# Patient Record
Sex: Female | Born: 1980 | Race: White | Hispanic: No | Marital: Married | Smoking: Never smoker
Health system: Southern US, Community
[De-identification: ages and names within clinical notes are randomized; demographics above are authoritative.]

## PROBLEM LIST (undated history)

## (undated) DIAGNOSIS — R519 Headache, unspecified: Secondary | ICD-10-CM

## (undated) DIAGNOSIS — F419 Anxiety disorder, unspecified: Secondary | ICD-10-CM

## (undated) DIAGNOSIS — Z9889 Other specified postprocedural states: Secondary | ICD-10-CM

## (undated) DIAGNOSIS — I10 Essential (primary) hypertension: Secondary | ICD-10-CM

## (undated) DIAGNOSIS — R51 Headache: Secondary | ICD-10-CM

## (undated) DIAGNOSIS — K219 Gastro-esophageal reflux disease without esophagitis: Secondary | ICD-10-CM

## (undated) DIAGNOSIS — O09299 Supervision of pregnancy with other poor reproductive or obstetric history, unspecified trimester: Secondary | ICD-10-CM

## (undated) DIAGNOSIS — O09529 Supervision of elderly multigravida, unspecified trimester: Secondary | ICD-10-CM

## (undated) DIAGNOSIS — Z6741 Type O blood, Rh negative: Secondary | ICD-10-CM

## (undated) DIAGNOSIS — R112 Nausea with vomiting, unspecified: Secondary | ICD-10-CM

## (undated) DIAGNOSIS — T8859XA Other complications of anesthesia, initial encounter: Secondary | ICD-10-CM

## (undated) HISTORY — DX: Supervision of pregnancy with other poor reproductive or obstetric history, unspecified trimester: O09.299

## (undated) HISTORY — PX: BREAST SURGERY: SHX581

## (undated) HISTORY — DX: Type O blood, Rh negative: Z67.41

## (undated) HISTORY — DX: Gastro-esophageal reflux disease without esophagitis: K21.9

## (undated) HISTORY — DX: Supervision of elderly multigravida, unspecified trimester: O09.529

## (undated) HISTORY — PX: BUNIONECTOMY: SHX129

---

## 2003-11-07 ENCOUNTER — Ambulatory Visit: Payer: Self-pay | Admitting: Podiatry

## 2004-12-17 ENCOUNTER — Ambulatory Visit: Payer: Self-pay | Admitting: Obstetrics and Gynecology

## 2005-01-03 HISTORY — PX: LAPAROSCOPY: SHX197

## 2005-08-19 ENCOUNTER — Ambulatory Visit: Payer: Self-pay | Admitting: Unknown Physician Specialty

## 2005-09-09 ENCOUNTER — Ambulatory Visit: Payer: Self-pay | Admitting: Unknown Physician Specialty

## 2006-06-21 ENCOUNTER — Ambulatory Visit: Payer: Self-pay | Admitting: Family Medicine

## 2007-01-04 IMAGING — US ABDOMEN ULTRASOUND
1 series · 17 of 25 positions shown · non-contrast
Comparison: none

REASON FOR EXAM: abd pain epigastric N/V
COMMENTS:

[Series 1: abdomen ultrasound · 17 of 63 slices shown]
[im 1/63]
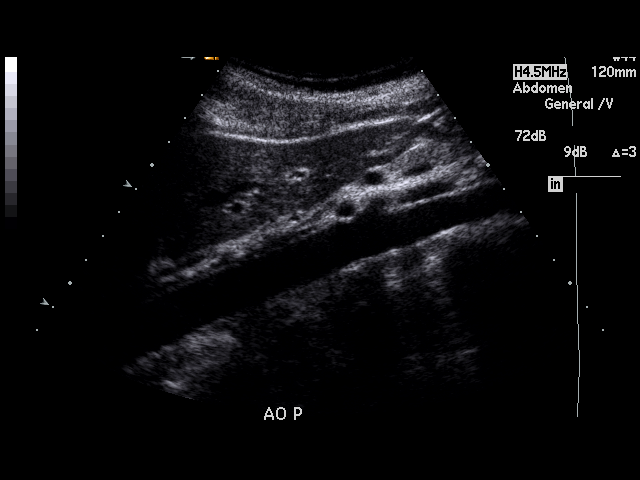
[im 6/63]
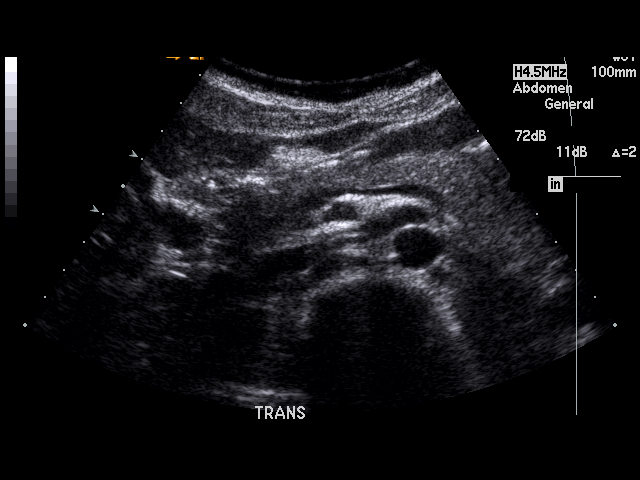
[im 8/63]
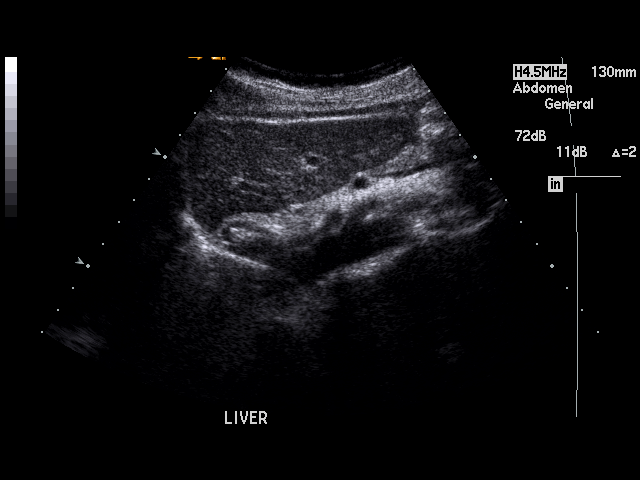
[im 13/63]
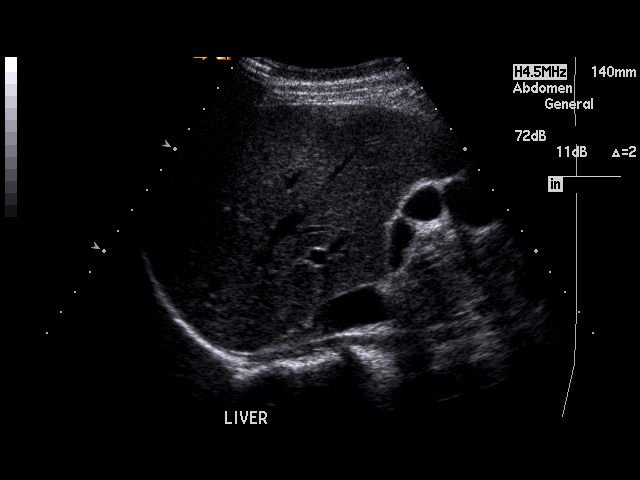
[im 16/63]
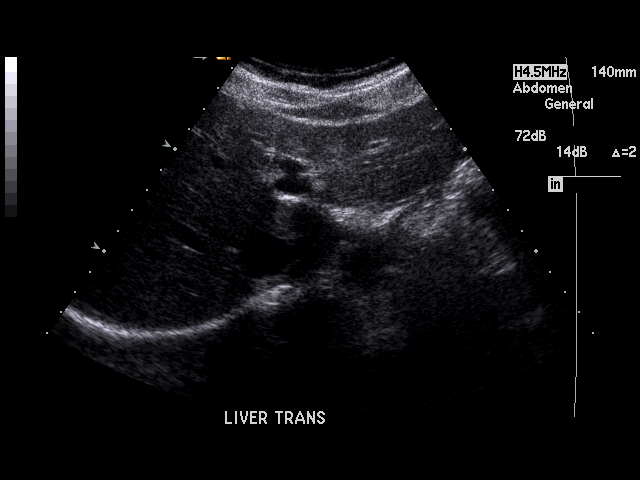
[im 21/63]
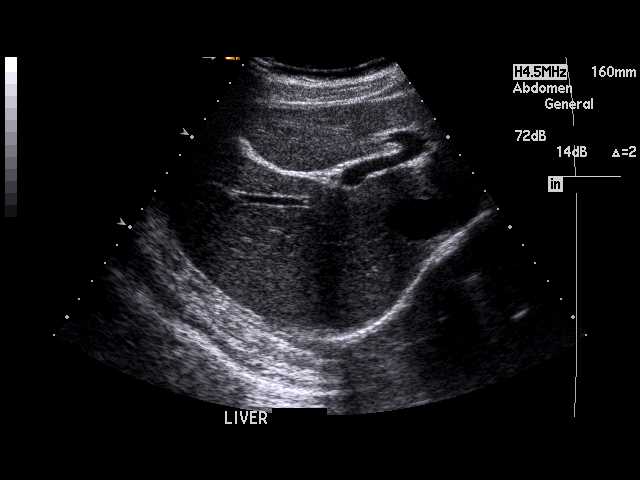
[im 24/63]
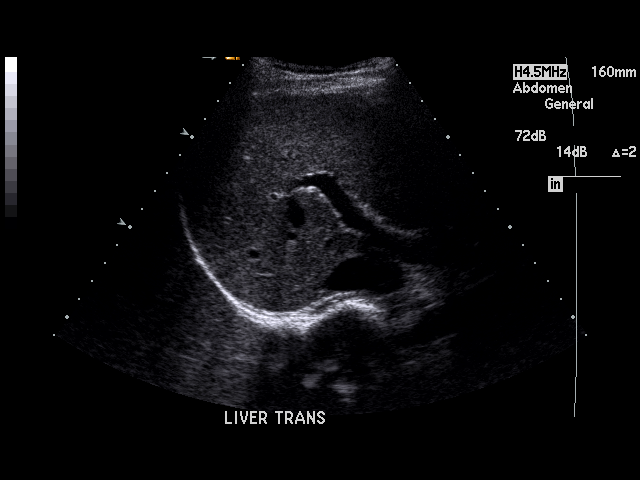
[im 29/63]
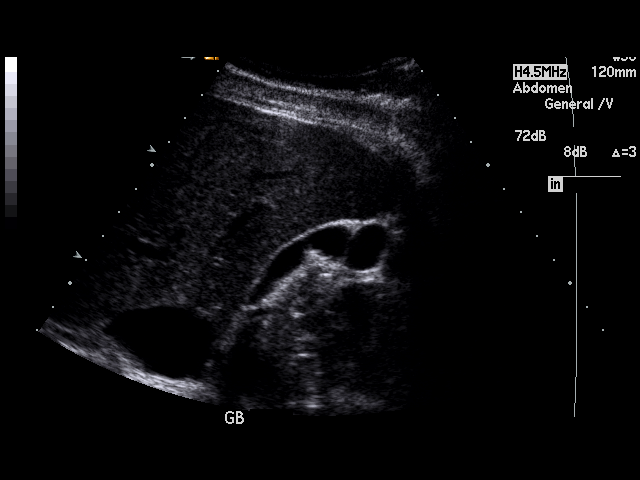
[im 32/63]
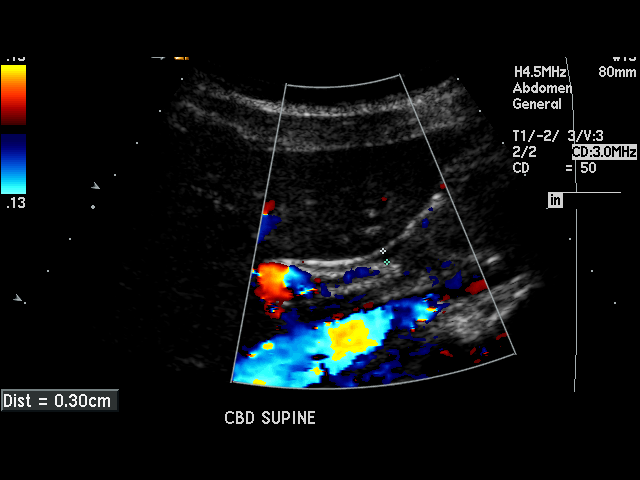
[im 34/63]
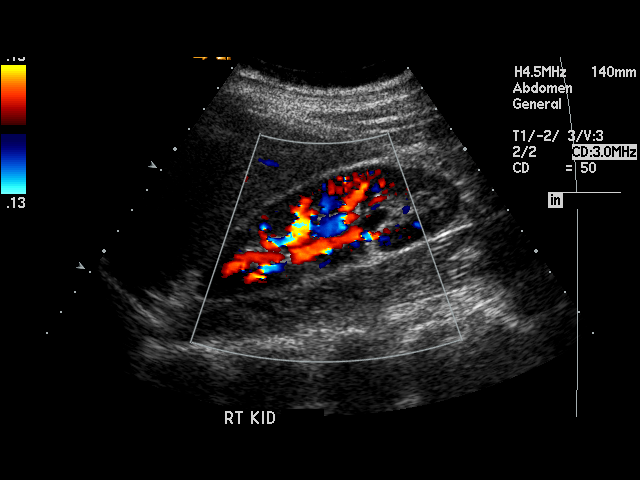
[im 39/63]
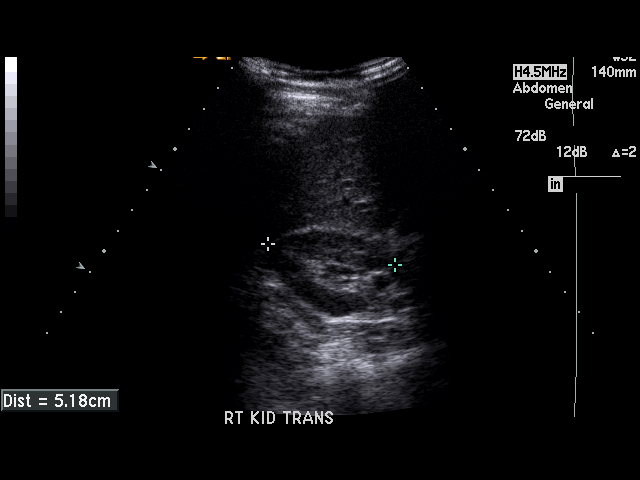
[im 42/63]
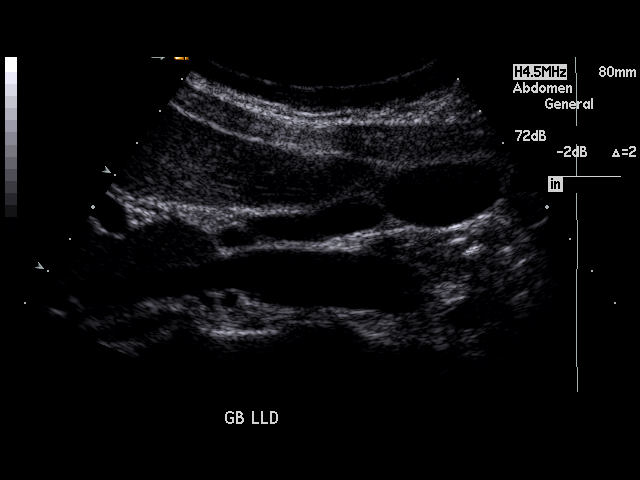
[im 47/63]
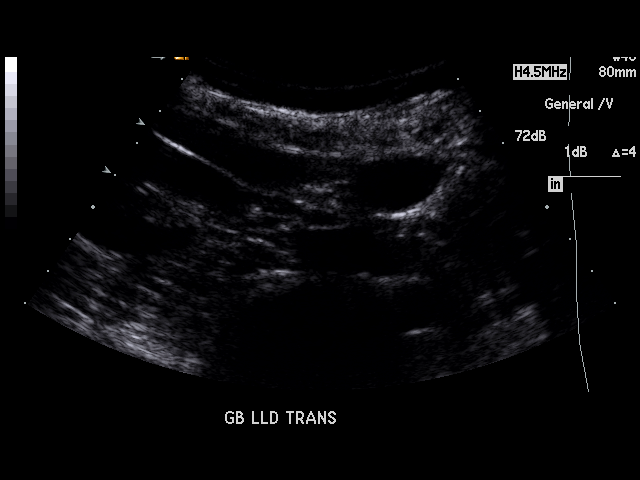
[im 50/63]
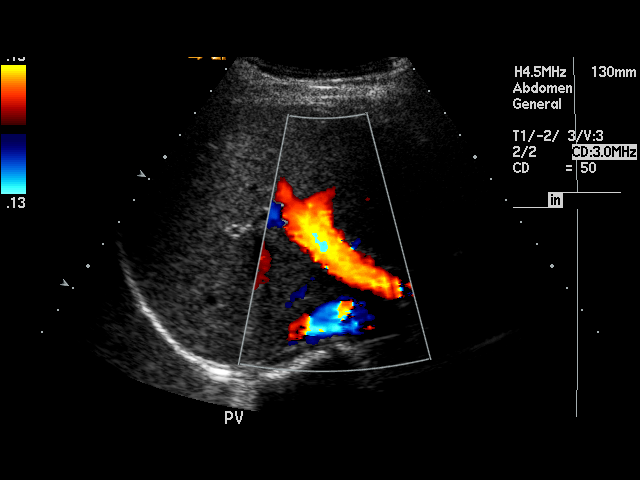
[im 55/63]
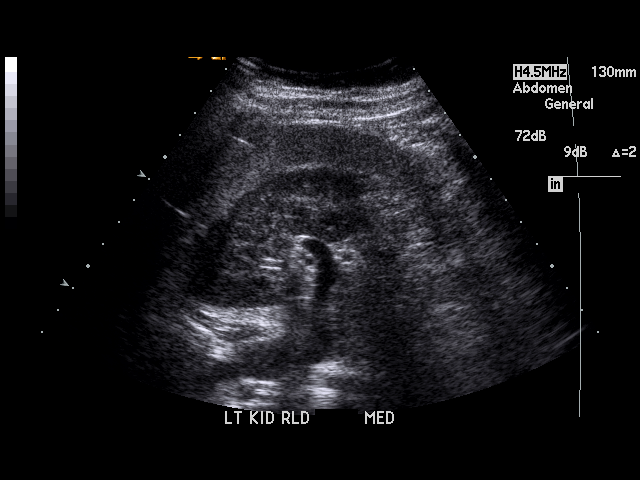
[im 57/63]
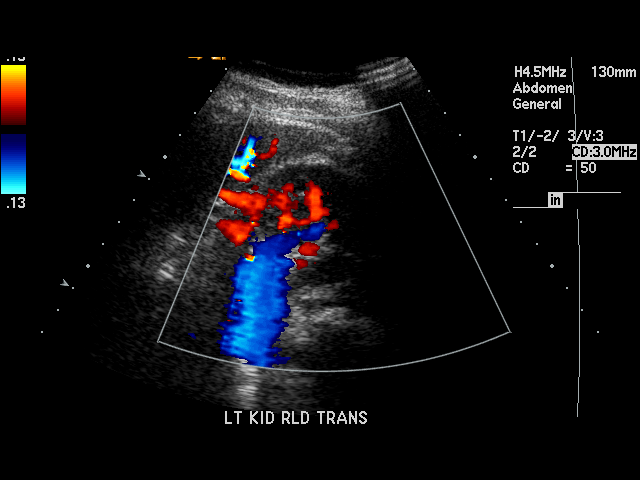
[im 63/63]
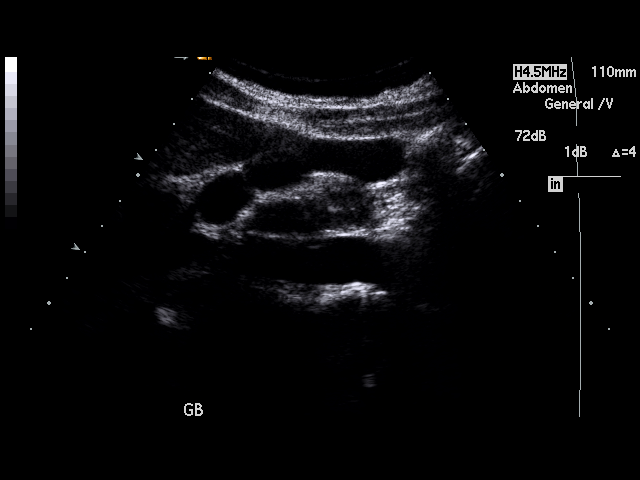

[17 of 25 positions shown; findings below may reference images not displayed]

PROCEDURE:     US  - US ABDOMEN GENERAL SURVEY  - September 09, 2005  [DATE]

RESULT:     The liver, spleen and pancreas are normal in appearance. No
gallstones are seen. There is no thickening of the gallbladder wall. The
common bile duct measures 3.1 mm in diameter, which is within normal limits.
The kidneys show no hydronephrosis. There is no ascites.
IMPRESSION: 1)No significant abnormalities are noted.

## 2009-12-31 ENCOUNTER — Ambulatory Visit: Payer: Self-pay | Admitting: Obstetrics & Gynecology

## 2010-09-10 ENCOUNTER — Emergency Department: Payer: Self-pay | Admitting: Emergency Medicine

## 2010-11-08 ENCOUNTER — Ambulatory Visit: Payer: Self-pay | Admitting: Obstetrics & Gynecology

## 2011-10-11 ENCOUNTER — Observation Stay: Payer: Self-pay | Admitting: Obstetrics & Gynecology

## 2011-10-11 LAB — PIH PROFILE
BUN: 8 mg/dL (ref 7–18)
Chloride: 109 mmol/L — ABNORMAL HIGH (ref 98–107)
Creatinine: 0.67 mg/dL (ref 0.60–1.30)
EGFR (African American): >60
MCH: 30.1 pg (ref 26.0–34.0)
MCV: 92 fL (ref 80–100)
Platelet: 126 10*3/uL — ABNORMAL LOW (ref 150–440)
Potassium: 4 mmol/L (ref 3.5–5.1)
RBC: 4.02 10*6/uL (ref 3.80–5.20)
Uric Acid: 3.3 mg/dL (ref 2.6–6.0)

## 2011-10-12 LAB — PROTEIN / CREATININE RATIO, URINE
Creatinine, Urine: 57.6 mg/dL (ref 30.0–125.0)
Protein/Creat. Ratio: 4080 mg/gCREAT — ABNORMAL HIGH (ref 0–200)

## 2011-10-16 DIAGNOSIS — O09299 Supervision of pregnancy with other poor reproductive or obstetric history, unspecified trimester: Secondary | ICD-10-CM

## 2014-04-18 DIAGNOSIS — F064 Anxiety disorder due to known physiological condition: Secondary | ICD-10-CM | POA: Insufficient documentation

## 2014-04-18 DIAGNOSIS — B9689 Other specified bacterial agents as the cause of diseases classified elsewhere: Secondary | ICD-10-CM | POA: Insufficient documentation

## 2014-04-18 DIAGNOSIS — J069 Acute upper respiratory infection, unspecified: Secondary | ICD-10-CM

## 2014-05-13 NOTE — H&P (Signed)
L&D Evaluation:  History Expanded:   HPI 34 yo first baby years of infertility now at 67 weeks with known gestational HTN over last two weeks checking BP at home and recently taken out of work and NOW NOTICED HIGHER BPs AT HOME.  No h/a, blurry vision, CP.  Patient has had edema of lower extremities and in face when first wakes in mornings.  Pregnancy Induced Hypertension labs 8 days ago normal.    Gravida 2    Term 0    PreTerm 0    Abortion 1    Living 0    Blood Type (Maternal) O negative    Maternal Varicella Equivocal    Rubella Results (Maternal) immune    Presents with HTN    Patient's Medical History GERD    Patient's Surgical History Laparoscopy, Bunionectomy    Medications Pre Natal Vitamins    Allergies Sulfa    Social History none    Family History Non-Contributory   ROS:   ROS All systems were reviewed.  HEENT, CNS, GI, GU, Respiratory, CV, Renal and Musculoskeletal systems were found to be normal.   Exam:   Vital Signs stable  BPs here 130s/80s    General no apparent distress    Mental Status clear    Chest clear    Heart normal sinus rhythm, no murmur/gallop/rubs    Abdomen gravid, non-tender    Estimated Fetal Weight Average for gestational age    Back no CVAT    Edema 1+    Reflexes 2+    Clonus negative    FHT normal rate with no decels    Ucx absent    Skin dry    Other LABS-  see reports, Plt 126 and Urine Prot/Cr ratio 4000   Impression:   Impression evaluation for PIH, with concern for preclampsia   Plan:   Plan monitor BP, 24 hour urine protein, Bed rest    Comments In house for now Steroids Consider transfer to [facility] if anticipate delivery Repeat labs in am, to go along with 24 hour urine results  Patient is counseled at length about the risks and consequences of Pregnancy Induced Hypertension, especially as it pertains to the development of preclampsia.  Risks of preclampsia include maternal and fetal  complications, usually necessitating active delivery planning.  Physical exam findings, fetal heart rate monitoring, and laboratory findings are utilized together to determine the presence and severity of any gestational hypertension disorder.  Further management will be based on these findings.  Continued bed rest and frequent follow-up for blood pressure checks and symptom evaluation is of utmost importance for the remainder of pregnancy.   Patient has been counseled as to the risks of prematurity, including risks of respiratory depression or distress, jaundice, feeding or temperature regulation problems, neurologic concerns including hearing or visual problems, and brain complications.   Electronic Signatures: Hoyt Koch (MD)  (Signed 09-Oct-13 07:22)  Authored: L&D Evaluation   Last Updated: 09-Oct-13 07:22 by Hoyt Koch (MD)

## 2014-09-15 LAB — HM MAMMOGRAPHY

## 2015-02-13 ENCOUNTER — Ambulatory Visit: Payer: Self-pay | Admitting: Family Medicine

## 2015-02-13 ENCOUNTER — Ambulatory Visit (INDEPENDENT_AMBULATORY_CARE_PROVIDER_SITE_OTHER): Payer: PRIVATE HEALTH INSURANCE | Admitting: Family Medicine

## 2015-02-13 ENCOUNTER — Other Ambulatory Visit
Admission: RE | Admit: 2015-02-13 | Discharge: 2015-02-13 | Disposition: A | Discharge status: Home / Self Care | Payer: PRIVATE HEALTH INSURANCE | Source: Ambulatory Visit | Attending: Family Medicine | Admitting: Family Medicine

## 2015-02-13 ENCOUNTER — Ambulatory Visit
Admission: RE | Admit: 2015-02-13 | Discharge: 2015-02-13 | Disposition: A | Discharge status: Home / Self Care | Payer: PRIVATE HEALTH INSURANCE | Source: Ambulatory Visit | Attending: Family Medicine | Admitting: Family Medicine

## 2015-02-13 ENCOUNTER — Encounter: Payer: Self-pay | Admitting: Family Medicine

## 2015-02-13 VITALS — BP 128/88 | HR 88 | Temp 97.8°F | Resp 14 | Ht 64.0 in | Wt 126.0 lb

## 2015-02-13 DIAGNOSIS — J219 Acute bronchiolitis, unspecified: Secondary | ICD-10-CM | POA: Insufficient documentation

## 2015-02-13 DIAGNOSIS — R0600 Dyspnea, unspecified: Secondary | ICD-10-CM

## 2015-02-13 LAB — FIBRIN DERIVATIVES D-DIMER (ARMC ONLY): FIBRIN DERIVATIVES D-DIMER (ARMC): 191 (ref 0–499)

## 2015-02-13 MED ORDER — LEVOFLOXACIN 500 MG PO TABS: 500.0000 mg | ORAL_TABLET | Freq: Every day | ORAL | Status: DC

## 2015-02-13 MED ORDER — ALBUTEROL SULFATE (2.5 MG/3ML) 0.083% IN NEBU: 2.5000 mg | INHALATION_SOLUTION | Freq: Once | RESPIRATORY_TRACT | Status: DC

## 2015-02-13 NOTE — Progress Notes (Signed)
Name: Denise Clarke   MRN: [id number]    DOB: Jan 04, 1981   Date:02/13/2015       Progress Note  Subjective  Chief Complaint  Chief Complaint  Patient presents with  . Sinusitis    sore throat, dizziness, headache, cong x 4 days- taking Sudafed. Has a cough- brown production    Sinusitis This is a new problem. The current episode started in the past 7 days. The problem has been gradually worsening since onset. She is experiencing no pain. Associated symptoms include chills, congestion, coughing, diaphoresis, ear pain, headaches, a hoarse voice, neck pain, shortness of breath, sinus pressure, sneezing and a sore throat. Pertinent negatives include no swollen glands. Past treatments include acetaminophen and oral decongestants. The treatment provided no relief.  Cough This is a new problem. The current episode started in the past 7 days. The problem has been gradually worsening. The cough is productive of brown sputum. Associated symptoms include chills, ear pain, a fever, headaches, a sore throat and shortness of breath. Pertinent negatives include no chest pain, heartburn, myalgias, nasal congestion, postnasal drip, rash, weight loss or wheezing. The treatment provided mild relief. There is no history of environmental allergies.    No problem-specific assessment & plan notes found for this encounter.   History reviewed. No pertinent past medical history.  Past Surgical History  Procedure Laterality Date  . Cesarean section      Family History  Problem Relation Age of Onset  . Diabetes Father   . Cancer Brother     Social History   Social History  . Marital Status: Married    Spouse Name: N/A  . Number of Children: N/A  . Years of Education: N/A   Occupational History  . Not on file.   Social History Main Topics  . Smoking status: Never Smoker   . Smokeless tobacco: Not on file  . Alcohol Use: 0.0 oz/week    0 Standard drinks or equivalent per week  . Drug Use: No   . Sexual Activity: Yes   Other Topics Concern  . Not on file   Social History Narrative    Allergies  Allergen Reactions  . Sulfa Antibiotics      Review of Systems  Constitutional: Positive for fever, chills and diaphoresis. Negative for weight loss and malaise/fatigue.  HENT: Positive for congestion, ear pain, hoarse voice, sinus pressure, sneezing and sore throat. Negative for ear discharge and postnasal drip.   Eyes: Negative for blurred vision.  Respiratory: Positive for cough and shortness of breath. Negative for sputum production and wheezing.   Cardiovascular: Negative for chest pain, palpitations and leg swelling.  Gastrointestinal: Negative for heartburn, nausea, abdominal pain, diarrhea, constipation, blood in stool and melena.  Genitourinary: Negative for dysuria, urgency, frequency and hematuria.  Musculoskeletal: Positive for neck pain. Negative for myalgias, back pain and joint pain.  Skin: Negative for rash.  Neurological: Positive for headaches. Negative for dizziness, tingling, sensory change and focal weakness.  Endo/Heme/Allergies: Negative for environmental allergies and polydipsia. Does not bruise/bleed easily.  Psychiatric/Behavioral: Negative for depression and suicidal ideas. The patient is not nervous/anxious and does not have insomnia.      Objective  Filed Vitals:   02/13/15 1114  BP: 128/88  Pulse: 88  Temp: 97.8 F (36.6 C)  TempSrc: Oral  Resp: 14  Height: 5\' 4"  (1.626 m)  Weight: 126 lb (57.153 kg)  SpO2: 99%    Physical Exam  Constitutional: She is well-developed, well-nourished, and in no  distress. No distress.  HENT:  Head: Normocephalic and atraumatic.  Right Ear: Tympanic membrane, external ear and ear canal normal.  Left Ear: Tympanic membrane, external ear and ear canal normal.  Nose: Nose normal.  Mouth/Throat: Oropharynx is clear and moist.  Eyes: Conjunctivae and EOM are normal. Pupils are equal, round, and reactive to  light. Right eye exhibits no discharge. Left eye exhibits no discharge.  Neck: Normal range of motion. Neck supple. No JVD present. No thyromegaly present.  Cardiovascular: Normal rate, regular rhythm, normal heart sounds and intact distal pulses.  Exam reveals no gallop and no friction rub.   No murmur heard. Pulmonary/Chest: Effort normal and breath sounds normal.  Abdominal: Soft. Bowel sounds are normal. She exhibits no mass. There is no tenderness. There is no guarding.  Musculoskeletal: Normal range of motion. She exhibits no edema.  Lymphadenopathy:    She has no cervical adenopathy.  Neurological: She is alert.  Skin: Skin is warm and dry. She is not diaphoretic.  Psychiatric: Mood and affect normal.  Nursing note and vitals reviewed.     Assessment & Plan  Problem List Items Addressed This Visit    None    Visit Diagnoses    Bronchiolitis    -  Primary    Relevant Medications    albuterol (PROVENTIL) (2.5 MG/3ML) 0.083% nebulizer solution 2.5 mg    Other Relevant Orders    DG Chest 2 View    Dyspnea             Dr. Otilio Miu [facility]  02/13/2015

## 2015-08-13 ENCOUNTER — Ambulatory Visit (INDEPENDENT_AMBULATORY_CARE_PROVIDER_SITE_OTHER): Payer: PRIVATE HEALTH INSURANCE | Admitting: Family Medicine

## 2015-08-13 ENCOUNTER — Encounter: Payer: Self-pay | Admitting: Family Medicine

## 2015-08-13 VITALS — BP 110/62 | HR 80 | Ht 64.0 in | Wt 127.0 lb

## 2015-08-13 DIAGNOSIS — Z23 Encounter for immunization: Secondary | ICD-10-CM | POA: Diagnosis not present

## 2015-08-13 DIAGNOSIS — J189 Pneumonia, unspecified organism: Secondary | ICD-10-CM

## 2015-08-13 DIAGNOSIS — T63461A Toxic effect of venom of wasps, accidental (unintentional), initial encounter: Secondary | ICD-10-CM

## 2015-08-13 DIAGNOSIS — J45901 Unspecified asthma with (acute) exacerbation: Secondary | ICD-10-CM | POA: Diagnosis not present

## 2015-08-13 MED ORDER — ALBUTEROL SULFATE HFA 108 (90 BASE) MCG/ACT IN AERS: 2.0000 | INHALATION_SPRAY | Freq: Four times a day (QID) | RESPIRATORY_TRACT | 0 refills | Status: DC | PRN

## 2015-08-13 MED ORDER — AZITHROMYCIN 250 MG PO TABS: ORAL_TABLET | ORAL | 0 refills | Status: DC

## 2015-08-13 NOTE — Progress Notes (Signed)
Name: Denise Clarke   MRN: [id number]    DOB: 1980-07-14   Date:08/13/2015       Progress Note  Subjective  Chief Complaint  Chief Complaint  Patient presents with  . Asthma    started Saturday after doing yard work- noticed rattling in chest when breathing. Feels SOB when lying down at night.   . Insect Bite    Asthma  She complains of cough, shortness of breath and wheezing. There is no chest tightness, difficulty breathing, frequent throat clearing, hemoptysis, hoarse voice or sputum production. This is a recurrent problem. The current episode started in the past 7 days. The problem occurs daily. The problem has been waxing and waning. The cough is non-productive. Associated symptoms include dyspnea on exertion, sneezing and sweats. Pertinent negatives include no chest pain, ear congestion, ear pain, fever, headaches, heartburn, malaise/fatigue, myalgias, nasal congestion, orthopnea, PND, postnasal drip, rhinorrhea, sore throat or weight loss. Her symptoms are aggravated by nothing. Her symptoms are alleviated by nothing. She reports minimal (nebulization) improvement on treatment. Her past medical history is significant for asthma. There is no history of bronchiectasis, bronchitis, COPD, emphysema or pneumonia.    No problem-specific Assessment & Plan notes found for this encounter.   History reviewed. No pertinent past medical history.  Past Surgical History:  Procedure Laterality Date  . CESAREAN SECTION      Family History  Problem Relation Age of Onset  . Diabetes Father   . Cancer Brother     Social History   Social History  . Marital status: Married    Spouse name: N/A  . Number of children: N/A  . Years of education: N/A   Occupational History  . Not on file.   Social History Main Topics  . Smoking status: Never Smoker  . Smokeless tobacco: Never Used  . Alcohol use 0.0 oz/week  . Drug use: No  . Sexual activity: Yes   Other Topics Concern  . Not on  file   Social History Narrative  . No narrative on file    Allergies  Allergen Reactions  . Sulfa Antibiotics      Review of Systems  Constitutional: Negative for chills, fever, malaise/fatigue and weight loss.  HENT: Positive for sneezing. Negative for ear discharge, ear pain, hoarse voice, postnasal drip, rhinorrhea and sore throat.   Eyes: Negative for blurred vision.  Respiratory: Positive for cough, shortness of breath and wheezing. Negative for hemoptysis and sputum production.   Cardiovascular: Positive for dyspnea on exertion. Negative for chest pain, palpitations, leg swelling and PND.  Gastrointestinal: Negative for abdominal pain, blood in stool, constipation, diarrhea, heartburn, melena and nausea.  Genitourinary: Negative for dysuria, frequency, hematuria and urgency.  Musculoskeletal: Negative for back pain, joint pain, myalgias and neck pain.  Skin: Negative for rash.  Neurological: Negative for dizziness, tingling, sensory change, focal weakness and headaches.  Endo/Heme/Allergies: Negative for environmental allergies and polydipsia. Does not bruise/bleed easily.  Psychiatric/Behavioral: Negative for depression and suicidal ideas. The patient is not nervous/anxious and does not have insomnia.      Objective  Vitals:   08/13/15 0845  BP: 110/62  Pulse: 80  SpO2: 99%  Weight: 127 lb (57.6 kg)  Height: 5\' 4"  (1.626 m)    Physical Exam  Constitutional: She is well-developed, well-nourished, and in no distress. No distress.  HENT:  Head: Normocephalic and atraumatic.  Right Ear: External ear normal.  Left Ear: External ear normal.  Nose: Nose normal.  Mouth/Throat: Oropharynx  is clear and moist.  Eyes: Conjunctivae and EOM are normal. Pupils are equal, round, and reactive to light. Right eye exhibits no discharge. Left eye exhibits no discharge.  Neck: Normal range of motion. Neck supple. No JVD present. No thyromegaly present.  Cardiovascular: Normal rate,  regular rhythm, normal heart sounds and intact distal pulses.  Exam reveals no gallop and no friction rub.   No murmur heard. Pulmonary/Chest: Effort normal. No respiratory distress. She has decreased breath sounds in the left lower field. She has wheezes in the left lower field. She has no rhonchi. She has no rales. She exhibits no tenderness.  Abdominal: Soft. Bowel sounds are normal. She exhibits no mass. There is no tenderness. There is no guarding.  Musculoskeletal: Normal range of motion. She exhibits no edema.  Lymphadenopathy:    She has no cervical adenopathy.  Neurological: She is alert. She has normal reflexes.  Skin: Skin is warm and dry. She is not diaphoretic.  Psychiatric: Mood and affect normal.  Nursing note and vitals reviewed.     Assessment & Plan  Problem List Items Addressed This Visit    None    Visit Diagnoses    Wasp sting, accidental or unintentional, initial encounter    -  Primary   Relevant Orders   Tdap vaccine greater than or equal to 7yo IM (Completed)   CAP (community acquired pneumonia)       Relevant Medications   azithromycin (ZITHROMAX) 250 MG tablet   albuterol (PROVENTIL HFA;VENTOLIN HFA) 108 (90 Base) MCG/ACT inhaler   Reactive airway disease with acute exacerbation       Relevant Medications   albuterol (PROVENTIL HFA;VENTOLIN HFA) 108 (90 Base) MCG/ACT inhaler        Dr. [facility]  08/13/15

## 2015-10-23 LAB — HEPATIC FUNCTION PANEL
ALK PHOS: 48 U/L (ref 25–125)
ALT: 16 U/L (ref 7–35)
AST: 17 U/L (ref 13–35)
Bilirubin, Total: 0.6 mg/dL

## 2015-10-23 LAB — OB RESULTS CONSOLE HEPATITIS B SURFACE ANTIGEN: Hepatitis B Surface Ag: NEGATIVE

## 2015-10-23 LAB — OB RESULTS CONSOLE HGB/HCT, BLOOD
HCT: 41 %
HEMOGLOBIN: 14.3 g/dL

## 2015-10-23 LAB — OB RESULTS CONSOLE GC/CHLAMYDIA
CHLAMYDIA, DNA PROBE: NEGATIVE
GC PROBE AMP, GENITAL: NEGATIVE

## 2015-10-23 LAB — BASIC METABOLIC PANEL
BUN: 11 mg/dL (ref 4–21)
CREATININE: 0.8 mg/dL (ref 0.5–1.1)
Glucose: 85 mg/dL
POTASSIUM: 4.6 mmol/L (ref 3.4–5.3)
Sodium: 140 mmol/L (ref 137–147)

## 2015-10-23 LAB — OB RESULTS CONSOLE RPR: RPR: NONREACTIVE

## 2015-10-23 LAB — OB RESULTS CONSOLE VARICELLA ZOSTER ANTIBODY, IGG: VARICELLA IGG: NON-IMMUNE/NOT IMMUNE

## 2015-10-23 LAB — OB RESULTS CONSOLE ABO/RH: RH Type: NEGATIVE

## 2015-10-23 LAB — OB RESULTS CONSOLE RUBELLA ANTIBODY, IGM: Rubella: NON-IMMUNE/NOT IMMUNE

## 2015-10-23 LAB — OB RESULTS CONSOLE ANTIBODY SCREEN: ANTIBODY SCREEN: NEGATIVE

## 2015-10-23 LAB — OB RESULTS CONSOLE HIV ANTIBODY (ROUTINE TESTING): HIV: NONREACTIVE

## 2015-10-23 LAB — OB RESULTS CONSOLE PLATELET COUNT: PLATELETS: 190 10*3/uL

## 2015-10-23 LAB — HM PAP SMEAR

## 2016-03-11 ENCOUNTER — Encounter: Payer: Self-pay | Admitting: Obstetrics & Gynecology

## 2016-03-11 ENCOUNTER — Ambulatory Visit (INDEPENDENT_AMBULATORY_CARE_PROVIDER_SITE_OTHER): Payer: PRIVATE HEALTH INSURANCE | Admitting: Obstetrics & Gynecology

## 2016-03-11 ENCOUNTER — Telehealth: Payer: Self-pay | Admitting: Obstetrics & Gynecology

## 2016-03-11 VITALS — BP 120/80 | HR 98 | Wt 151.0 lb

## 2016-03-11 DIAGNOSIS — O0990 Supervision of high risk pregnancy, unspecified, unspecified trimester: Secondary | ICD-10-CM | POA: Insufficient documentation

## 2016-03-11 DIAGNOSIS — Z3A27 27 weeks gestation of pregnancy: Secondary | ICD-10-CM

## 2016-03-11 DIAGNOSIS — O09523 Supervision of elderly multigravida, third trimester: Secondary | ICD-10-CM | POA: Insufficient documentation

## 2016-03-11 DIAGNOSIS — Z98891 History of uterine scar from previous surgery: Secondary | ICD-10-CM | POA: Insufficient documentation

## 2016-03-11 LAB — HPV APTIMA
HPV APTIMA: NEGATIVE
HPV Aptima: NEGATIVE

## 2016-03-11 LAB — MATERNIT21  PLUS CORE+ESS, BLOOD
CHROMOSOME 18 RESULT: NEGATIVE
CHROMOSOME 21: NEGATIVE
Chromosome 13 Result: NEGATIVE
Y CHROMOSOME: NOT DETECTED

## 2016-03-11 NOTE — Telephone Encounter (Signed)
Lmtrc

## 2016-03-11 NOTE — Patient Instructions (Signed)
Cesarean Delivery °Cesarean birth, or cesarean delivery, is the surgical delivery of a baby through an incision in the abdomen and the uterus. This may be referred to as a C-section. This procedure may be scheduled ahead of time, or it may be done in an emergency situation. °Tell a health care provider about: °· Any allergies you have. °· All medicines you are taking, including vitamins, herbs, eye drops, creams, and over-the-counter medicines. °· Any problems you or family members have had with anesthetic medicines. °· Any blood disorders you have. °· Any surgeries you have had. °· Any medical conditions you have. °· Whether you or any members of your family have a history of deep vein thrombosis (DVT) or pulmonary embolism (PE). °What are the risks? °Generally, this is a safe procedure. However, problems may occur, including: °· Infection. °· Bleeding. °· Allergic reactions to medicines. °· Damage to other structures or organs. °· Blood clots. °· Injury to your baby. ° °What happens before the procedure? °· Follow instructions from your health care provider about eating or drinking restrictions. °· Follow instructions from your health care provider about bathing before your procedure to help reduce your risk of infection. °· If you know that you are going to have a cesarean delivery, do not shave your pubic area. Shaving before the procedure may increase your risk of infection. °· Ask your health care provider about: °? Changing or stopping your regular medicines. This is especially important if you are taking diabetes medicines or blood thinners. °? Your pain management plan. This is especially important if you plan to breastfeed your baby. °? How long you will be in the hospital after the procedure. °? Any concerns you may have about receiving blood products if you need them during the procedure. °? Cord blood banking, if you plan to collect your baby’s umbilical cord blood. °· You may also want to ask your  health care provider: °? Whether you will be able to hold or breastfeed your baby while you are still in the operating room. °? Whether your baby can stay with you immediately after the procedure and during your recovery. °? Whether a family member or a person of your choice can go with you into the operating room and stay with you during the procedure, immediately after the procedure, and during your recovery. °· Plan to have someone drive you home when you are discharged from the hospital. °What happens during the procedure? °· Fetal monitors will be placed on your abdomen to monitor your heart rate and your baby's heart rate. °· Depending on the reason for your cesarean delivery, you may have a physical exam or additional testing, such as an ultrasound. °· An IV tube will be inserted into one of your veins. °· You may have your blood or urine tested. °· You will be given antibiotic medicine to help prevent infection. °· You may be given a special warming gown to wear to keep your temperature stable. °· Hair may be removed from your pubic area. °· The skin of your pubic area and lower abdomen will be cleaned with a germ-killing solution (antiseptic). °· A catheter may be inserted into your bladder through your urethra. This drains your urine during the procedure. °· You may be given one or more of the following: °? A medicine to numb the area (local anesthetic). °? A medicine to make you fall asleep (general anesthetic). °? A medicine (regional anesthetic) that is injected into your back or through a small   thin tube placed in your back (spinal anesthetic or epidural anesthetic). This numbs everything below the injection site and allows you to stay awake during your procedure. If this makes you feel nauseous, tell your health care provider. Medicines will be available to help reduce any nausea you may feel. °· An incision will be made in your abdomen, and then in your uterus. °· If you are awake during your  procedure, you may feel tugging and pulling in your abdomen, but you should not feel pain. If you feel pain, tell your health care provider immediately. °· Your baby will be removed from your uterus. You may feel more pressure or pushing while this happens. °· Immediately after birth, your baby will be dried and kept warm. You may be able to hold and breastfeed your baby. The umbilical cord may be clamped and cut during this time. °· Your placenta will be removed from your uterus. °· Your incisions will be closed with stitches (sutures). Staples, skin glue, or adhesive strips may also be applied to the incision in your abdomen. °· Bandages (dressings) will be placed over the incision in your abdomen. °The procedure may vary among health care providers and hospitals. °What happens after the procedure? °· Your blood pressure, heart rate, breathing rate, and blood oxygen level will be monitored often until the medicines you were given have worn off. °· You may continue to receive fluids and medicines through an IV tube. °· You will have some pain. Medicines will be available to help control your pain. °· To help prevent blood clots: °? You may be given medicines. °? You may have to wear compression stockings or devices. °? You will be encouraged to walk around when you are able. °· Hospital staff will encourage and support bonding with your baby. Your hospital may allow you and your baby to stay in the same room (rooming in) during your hospital stay to encourage successful breastfeeding. °· You may be encouraged to cough and breathe deeply often. This helps to prevent lung problems. °· If you have a catheter draining your urine, it will be removed as soon as possible after your procedure. °This information is not intended to replace advice given to you by your health care provider. Make sure you discuss any questions you have with your health care provider. °Document Released: 12/20/2004 Document Revised: 05/28/2015  Document Reviewed: 09/30/2014 °Elsevier Interactive Patient Education © 2017 [employer] ° °

## 2016-03-11 NOTE — Progress Notes (Signed)
PNV, [facility], Glc soon, CS scheduled May 23 [facility]) due to prior classical.

## 2016-03-16 ENCOUNTER — Telehealth: Payer: Self-pay | Admitting: Obstetrics & Gynecology

## 2016-03-16 NOTE — Telephone Encounter (Signed)
Pt is calling about wanting to pre schedule all remaining appointment ahead of time because she feels like it is hard to have her schedule with the only provider she wants to see. Please advise. Pt would let an call back.

## 2016-03-16 NOTE — Telephone Encounter (Signed)
Sch ROB appts April 9, 23, May 7, 14, and preop 22 (if not already scheduled)

## 2016-03-16 NOTE — Telephone Encounter (Signed)
Pt has been schedule.

## 2016-03-29 ENCOUNTER — Other Ambulatory Visit: Payer: PRIVATE HEALTH INSURANCE

## 2016-03-29 ENCOUNTER — Encounter: Payer: PRIVATE HEALTH INSURANCE | Admitting: Certified Nurse Midwife

## 2016-03-30 ENCOUNTER — Other Ambulatory Visit: Payer: PRIVATE HEALTH INSURANCE

## 2016-03-30 ENCOUNTER — Ambulatory Visit (INDEPENDENT_AMBULATORY_CARE_PROVIDER_SITE_OTHER): Payer: PRIVATE HEALTH INSURANCE | Admitting: Obstetrics & Gynecology

## 2016-03-30 VITALS — BP 120/60 | HR 103 | Wt 154.0 lb

## 2016-03-30 DIAGNOSIS — Z6791 Unspecified blood type, Rh negative: Secondary | ICD-10-CM

## 2016-03-30 DIAGNOSIS — O09892 Supervision of other high risk pregnancies, second trimester: Secondary | ICD-10-CM | POA: Diagnosis not present

## 2016-03-30 DIAGNOSIS — O0990 Supervision of high risk pregnancy, unspecified, unspecified trimester: Secondary | ICD-10-CM

## 2016-03-30 DIAGNOSIS — O26892 Other specified pregnancy related conditions, second trimester: Principal | ICD-10-CM

## 2016-03-30 DIAGNOSIS — Z98891 History of uterine scar from previous surgery: Secondary | ICD-10-CM

## 2016-03-30 DIAGNOSIS — Z3A29 29 weeks gestation of pregnancy: Secondary | ICD-10-CM

## 2016-03-30 DIAGNOSIS — O09523 Supervision of elderly multigravida, third trimester: Secondary | ICD-10-CM

## 2016-03-30 DIAGNOSIS — Z3A27 27 weeks gestation of pregnancy: Secondary | ICD-10-CM

## 2016-03-30 NOTE — Addendum Note (Signed)
Addended by: Quintella Baton D on: 03/30/2016 11:09 AM   Modules accepted: Orders

## 2016-03-30 NOTE — Progress Notes (Signed)
ok to see [place] for visits and call [place] for CS -  Blood Type - O-, RI, VI, PAP ASCUS & -HPV (repeat pp)  Desires fetal DNA testing, has risk factors - normal XX  AMA -  h/o preclampsia; baby ASA daily - baseline labs WNL  PRIOR CS - [facility]. Plan 37 week CS w PH. Also desires BTL at time of CS Glucola today Rhogam

## 2016-03-31 LAB — 28 WEEK RH+PANEL
Basophils Absolute: 0 x10E3/uL (ref 0.0–0.2)
Basos: 0 %
EOS (ABSOLUTE): 0.2 x10E3/uL (ref 0.0–0.4)
Eos: 2 %
Gestational Diabetes Screen: 90 mg/dL (ref 65–139)
HIV Screen 4th Generation wRfx: NONREACTIVE
Hematocrit: 33.1 % — ABNORMAL LOW (ref 34.0–46.6)
Hemoglobin: 11 g/dL — ABNORMAL LOW (ref 11.1–15.9)
Immature Grans (Abs): 0.1 x10E3/uL (ref 0.0–0.1)
Immature Granulocytes: 1 %
Lymphocytes Absolute: 0.8 x10E3/uL (ref 0.7–3.1)
Lymphs: 10 %
MCH: 30.5 pg (ref 26.6–33.0)
MCHC: 33.2 g/dL (ref 31.5–35.7)
MCV: 92 fL (ref 79–97)
Monocytes Absolute: 0.6 x10E3/uL (ref 0.1–0.9)
Monocytes: 8 %
Neutrophils Absolute: 6.3 x10E3/uL (ref 1.4–7.0)
Neutrophils: 79 %
Platelets: 180 x10E3/uL (ref 150–379)
RBC: 3.61 x10E6/uL — ABNORMAL LOW (ref 3.77–5.28)
RDW: 14.1 % (ref 12.3–15.4)
RPR Ser Ql: NONREACTIVE
WBC: 8 x10E3/uL (ref 3.4–10.8)

## 2016-04-08 ENCOUNTER — Telehealth: Payer: Self-pay

## 2016-04-08 NOTE — Telephone Encounter (Signed)
Pt inquiring about lab results. She is extremely tired & feels like she could sleep all day long. [phone].

## 2016-04-10 NOTE — Telephone Encounter (Signed)
Normal blood sugar and blood count, so low energy not from labs. Let her know.

## 2016-04-11 NOTE — Telephone Encounter (Signed)
Pt states the lab results  that she got were low, pt states Friday she felt bad, stomach pain. Pt aware I would let you know, pt states she has a appt Friday.

## 2016-04-11 NOTE — Telephone Encounter (Signed)
OK, see her then

## 2016-04-15 ENCOUNTER — Ambulatory Visit (INDEPENDENT_AMBULATORY_CARE_PROVIDER_SITE_OTHER): Payer: PRIVATE HEALTH INSURANCE | Admitting: Obstetrics & Gynecology

## 2016-04-15 VITALS — BP 120/80 | Wt 155.0 lb

## 2016-04-15 DIAGNOSIS — Z98891 History of uterine scar from previous surgery: Secondary | ICD-10-CM

## 2016-04-15 DIAGNOSIS — Z3A32 32 weeks gestation of pregnancy: Secondary | ICD-10-CM

## 2016-04-15 DIAGNOSIS — O0990 Supervision of high risk pregnancy, unspecified, unspecified trimester: Secondary | ICD-10-CM

## 2016-04-15 DIAGNOSIS — O09523 Supervision of elderly multigravida, third trimester: Secondary | ICD-10-CM

## 2016-04-15 NOTE — Progress Notes (Signed)
No new complaints.  Good FM.  No s/sx preeclampsia or PTL.  ok to see [place] for visits and call [place] for CS -   Blood Type - O-, RI, VI, PAP ASCUS & -HPV (repeat pp)   Desires fetal DNA testing, has risk factors - normal XX   AMA -   h/o preclampsia; baby ASA daily - baseline labs WNL   PRIOR CS - [facility]. Plan 37 week CS w PH (May 23).  Also desires BTL at time of CS  No TDaP as got TDaP 08/2015

## 2016-04-25 ENCOUNTER — Ambulatory Visit (INDEPENDENT_AMBULATORY_CARE_PROVIDER_SITE_OTHER): Payer: PRIVATE HEALTH INSURANCE | Admitting: Obstetrics & Gynecology

## 2016-04-25 VITALS — BP 110/70 | HR 90 | Wt 157.0 lb

## 2016-04-25 DIAGNOSIS — Z98891 History of uterine scar from previous surgery: Secondary | ICD-10-CM

## 2016-04-25 DIAGNOSIS — Z3A33 33 weeks gestation of pregnancy: Secondary | ICD-10-CM

## 2016-04-25 DIAGNOSIS — O0990 Supervision of high risk pregnancy, unspecified, unspecified trimester: Secondary | ICD-10-CM

## 2016-04-25 DIAGNOSIS — O09523 Supervision of elderly multigravida, third trimester: Secondary | ICD-10-CM

## 2016-04-25 NOTE — Progress Notes (Signed)
Pt reports no problems, see above

## 2016-04-25 NOTE — Progress Notes (Signed)
No new complaints.  Good FM.  No s/sx preeclampsia or PTL.  ok to see [place] for visits and call [place] for CS -   Blood Type - O-, RI, VI, PAP ASCUS &-HPV (repeat pp)   Desires fetal DNA testing, has risk factors - normal XX   AMA -   h/o preclampsia; baby ASA daily - baseline labs WNL   PRIOR CS - [facility]. Plan 37 week CS w PH (May 23).  Also desires BTL at time of CS  No TDaP as got TDaP 08/2015

## 2016-04-26 ENCOUNTER — Encounter: Payer: PRIVATE HEALTH INSURANCE | Admitting: Obstetrics & Gynecology

## 2016-05-10 ENCOUNTER — Ambulatory Visit (INDEPENDENT_AMBULATORY_CARE_PROVIDER_SITE_OTHER): Payer: PRIVATE HEALTH INSURANCE | Admitting: Obstetrics & Gynecology

## 2016-05-10 VITALS — BP 120/80 | HR 83 | Wt 160.0 lb

## 2016-05-10 DIAGNOSIS — O09523 Supervision of elderly multigravida, third trimester: Secondary | ICD-10-CM

## 2016-05-10 DIAGNOSIS — O0990 Supervision of high risk pregnancy, unspecified, unspecified trimester: Secondary | ICD-10-CM

## 2016-05-10 DIAGNOSIS — Z98891 History of uterine scar from previous surgery: Secondary | ICD-10-CM

## 2016-05-10 DIAGNOSIS — Z3A35 35 weeks gestation of pregnancy: Secondary | ICD-10-CM

## 2016-05-10 NOTE — Progress Notes (Signed)
[place] discussed, NST and AFI if decreases (still feels 4-5 episodes daily of FM) PNV, CS and BTL discussed (May 23)

## 2016-05-16 ENCOUNTER — Ambulatory Visit (INDEPENDENT_AMBULATORY_CARE_PROVIDER_SITE_OTHER): Payer: PRIVATE HEALTH INSURANCE | Admitting: Obstetrics & Gynecology

## 2016-05-16 VITALS — BP 120/80 | HR 80 | Wt 162.0 lb

## 2016-05-16 DIAGNOSIS — Z3A36 36 weeks gestation of pregnancy: Secondary | ICD-10-CM

## 2016-05-16 DIAGNOSIS — O09523 Supervision of elderly multigravida, third trimester: Secondary | ICD-10-CM

## 2016-05-16 DIAGNOSIS — Z98891 History of uterine scar from previous surgery: Secondary | ICD-10-CM

## 2016-05-16 DIAGNOSIS — O0990 Supervision of high risk pregnancy, unspecified, unspecified trimester: Secondary | ICD-10-CM

## 2016-05-16 NOTE — Progress Notes (Signed)
No new complaints. Good FM. No s/sx preeclampsia or PTL. GBS today  ok to see [place] for visits and call [place] for CS -   Blood Type - O-, RI, VI, PAP ASCUS &-HPV (repeat pp)   Desires fetal DNA testing, has risk factors - normal XX   h/o preclampsia; baby ASA daily - baseline labs WNL   PRIOR CS - [facility]. Plan 37 week CS w PH (May 23).  Also desires BTL at time of CS  No TDaP as got TDaP 08/2015

## 2016-05-19 LAB — CULTURE, BETA STREP (GROUP B ONLY): Strep Gp B Culture: POSITIVE — AB

## 2016-05-24 ENCOUNTER — Encounter: Payer: Self-pay | Admitting: Obstetrics & Gynecology

## 2016-05-24 ENCOUNTER — Encounter: Payer: PRIVATE HEALTH INSURANCE | Admitting: Obstetrics & Gynecology

## 2016-05-24 ENCOUNTER — Ambulatory Visit (INDEPENDENT_AMBULATORY_CARE_PROVIDER_SITE_OTHER): Payer: PRIVATE HEALTH INSURANCE | Admitting: Obstetrics & Gynecology

## 2016-05-24 ENCOUNTER — Encounter
Admission: RE | Admit: 2016-05-24 | Discharge: 2016-05-24 | Disposition: A | Discharge status: Home / Self Care | Payer: PRIVATE HEALTH INSURANCE | Source: Ambulatory Visit | Attending: Obstetrics & Gynecology | Admitting: Obstetrics & Gynecology

## 2016-05-24 VITALS — BP 130/90 | HR 101 | Ht 64.0 in | Wt 160.0 lb

## 2016-05-24 DIAGNOSIS — Z98891 History of uterine scar from previous surgery: Secondary | ICD-10-CM

## 2016-05-24 DIAGNOSIS — O09523 Supervision of elderly multigravida, third trimester: Secondary | ICD-10-CM

## 2016-05-24 DIAGNOSIS — Z3A37 37 weeks gestation of pregnancy: Secondary | ICD-10-CM

## 2016-05-24 DIAGNOSIS — O0990 Supervision of high risk pregnancy, unspecified, unspecified trimester: Secondary | ICD-10-CM

## 2016-05-24 HISTORY — DX: Headache: R51

## 2016-05-24 HISTORY — DX: Headache, unspecified: R51.9

## 2016-05-24 LAB — CBC
HCT: 33 % — ABNORMAL LOW (ref 35.0–47.0)
HEMOGLOBIN: 11 g/dL — AB (ref 12.0–16.0)
MCH: 27.4 pg (ref 26.0–34.0)
MCHC: 33.3 g/dL (ref 32.0–36.0)
MCV: 82.2 fL (ref 80.0–100.0)
Platelets: 169 10*3/uL (ref 150–440)
RBC: 4.01 MIL/uL (ref 3.80–5.20)
RDW: 15.7 % — ABNORMAL HIGH (ref 11.5–14.5)
WBC: 7.1 10*3/uL (ref 3.6–11.0)

## 2016-05-24 LAB — SURGICAL PCR SCREEN
MRSA, PCR: NEGATIVE
STAPHYLOCOCCUS AUREUS: NEGATIVE

## 2016-05-24 MED ORDER — CEFAZOLIN SODIUM-DEXTROSE 2-4 GM/100ML-% IV SOLN
2.0000 g | INTRAVENOUS | Status: DC
  Filled 2016-05-24 (×2): qty 100

## 2016-05-24 NOTE — Pre-Procedure Instructions (Signed)
Denise Clarke in Labor and Delivery notified of positive antibody screen results from lab.

## 2016-05-24 NOTE — Progress Notes (Signed)
PRE-OPERATIVE HISTORY AND PHYSICAL EXAM  HPI:  Denise Clarke is a 36 y.o. [id number].  Patient's last menstrual period was 09/07/2015.  [redacted]w[redacted]d Estimated Date of Delivery: 06/10/16  She is being admitted for Prior uterine surgery prior classical CS at 27 weeks 4 years ago for severe preeclampsia; also desires sterilization procedure. PNC at [facility]. O-, received Rhogam.  AMA, has normal cfDNA and XX.  PMHx: She  has a past medical history of AMA (advanced maternal age) multigravida 35+; Esophageal reflux; Headache; pre-eclampsia in prior pregnancy, currently pregnant; and Type O blood, Rh negative. Also,  has a past surgical history that includes Cesarean section (2013); Bunionectomy; and laparoscopy (2007)., family history includes Breast cancer (age of onset: 65) in her maternal grandmother; Cancer in her brother; Diabetes in her father; Hypertension in her father.,  reports that she has never smoked. She has never used smokeless tobacco. She reports that she does not drink alcohol or use drugs.  No outpatient prescriptions have been marked as taking for the 05/24/16 encounter (Office Visit) with Gae Dry, MD.   Also, is allergic to sulfa antibiotics.  Review of Systems  Constitutional: Negative for chills, fever and malaise/fatigue.  HENT: Negative for congestion, sinus pain and sore throat.   Eyes: Negative for blurred vision and pain.  Respiratory: Negative for cough and wheezing.   Cardiovascular: Negative for chest pain and leg swelling.  Gastrointestinal: Negative for abdominal pain, constipation, diarrhea, heartburn, nausea and vomiting.  Genitourinary: Negative for dysuria, frequency, hematuria and urgency.  Musculoskeletal: Negative for back pain, joint pain, myalgias and neck pain.  Skin: Negative for itching and rash.  Neurological: Negative for dizziness, tremors and weakness.  Endo/Heme/Allergies: Does not bruise/bleed easily.  Psychiatric/Behavioral: Negative for  depression. The patient is not nervous/anxious and does not have insomnia.     Objective: BP 130/90   Pulse (!) 101   Ht 5\' 4"  (1.626 m)   Wt 160 lb (72.6 kg)   LMP 09/07/2015   BMI 27.46 kg/m  Filed Weights   05/24/16 1302  Weight: 160 lb (72.6 kg)   Physical Exam  Constitutional: She is oriented to person, place, and time. She appears well-developed and well-nourished. No distress.  HENT:  Head: Normocephalic.  Right Ear: Hearing normal.  Left Ear: Hearing normal.  Nose: Nose normal.  Mouth/Throat: Uvula is midline, oropharynx is clear and moist and mucous membranes are normal.  Eyes: Conjunctivae, EOM and lids are normal. Pupils are equal, round, and reactive to light.  Neck: Full passive range of motion without pain. No thyroid mass and no thyromegaly present.  Cardiovascular: Regular rhythm, normal heart sounds and normal pulses.  Exam reveals no gallop and no friction rub.   No murmur heard. Pulmonary/Chest: Effort normal and breath sounds normal.  Abdominal: Soft. Normal appearance and bowel sounds are normal. There is no hepatosplenomegaly. There is no tenderness.  Gravid Normal FHTs  Musculoskeletal: Normal range of motion.  Neurological: She is alert and oriented to person, place, and time.  Skin: Skin is warm and dry.  Psychiatric: She has a normal mood and affect.  Vitals reviewed.   OB History  Gravida Para Term Preterm AB Living  3 1   1 1 1   SAB TAB Ectopic Multiple Live Births  1       1    # Outcome Date GA Lbr Len/2nd Weight Sex Delivery Anes PTL Lv  3 Current  2 Preterm 10/16/11 [redacted]w[redacted]d   F CS-Classical   LIV     Complications: Hx of pre-eclampsia in prior pregnancy, currently pregnant  1 SAB             Patient denies any other pertinent gynecologic issues. See prenatal record for more complete H&P  Assessment: 1. [redacted] weeks gestation of pregnancy   2. History of cesarean section, classical   3. Elderly multigravida in third trimester     4. High risk pregnancy, antepartum     PLAN: 1.  Cesarean Delivery as Scheduled. 2.  Bilateral Tubal Ligation for sterilization  Patient will undergo surgical management with Cesarean Section.   The risks of surgery were discussed in detail with the patient including but not limited to: bleeding which may require transfusion or reoperation; infection which may require antibiotics; injury to surrounding organs which may involve bowel, bladder, ureters ; need for additional procedures including laparoscopy or laparotomy; thromboembolic phenomenon, surgical site problems and other postoperative/anesthesia complications. Likelihood of success in alleviating the patient's condition was discussed. Routine postoperative instructions will be reviewed with the patient and her family in detail after surgery.  The patient concurred with the proposed plan, giving informed written consent for the surgery.  Patient will be NPO procedure.  Preoperative prophylactic antibiotics, as necessary, and SCDs ordered on call to the OR.  The patient has been fully informed about all methods of contraception, both temporary and permanent. She understands that tubal ligation is meant to be permanent, absolute and irreversible. She was told that there is an approximately 1 in 400 chance of a pregnancy in the future after tubal ligation. She was told the short and long term complications of tubal ligation. She understands the risks from this surgery include, but are not limited to, the risks of anesthesia, hemorrhage, infection, perforation, and injury to adjacent structures, bowel, bladder and blood vessels.   Barnett Applebaum, M.D. 05/24/2016 1:18 PM

## 2016-05-24 NOTE — Patient Instructions (Signed)
  Your procedure is scheduled on: May 25, 2016 (Wednesday) Report to [place] ARRIVAL TIME 10:00 am    Remember: Instructions that are not followed completely may result in serious medical risk, up to and including death, or upon the discretion of your surgeon and anesthesiologist your surgery may need to be rescheduled.    _x___ 1. Do not eat food or drink liquids after midnight. No gum chewing or  hard candies                           __x__ 2. No Alcohol for 24 hours before or after surgery.   __x__3. No Smoking for 24 prior to surgery.   ____  4. Bring all medications with you on the day of surgery if instructed.    __x__ 5. Notify your doctor if there is any change in your medical condition     (cold, fever, infections).     Do not wear jewelry, make-up, hairpins, clips or nail polish.  Do not wear lotions, powders, or perfumes. You may wear deodorant.  Do not shave 48 hours prior to surgery. Men may shave face and neck.  Do not bring valuables to the hospital.    [facility] is not responsible for any belongings or valuables.               Contacts, dentures or bridgework may not be worn into surgery.  Leave your suitcase in the car. After surgery it may be brought to your room.  For patients admitted to the hospital, discharge time is determined by your  treatment team                       Patients discharged the day of surgery will not be allowed to drive home.  You will need someone to drive you home and stay with you the night of your procedure.    Please read over the following fact sheets that you were given:   [facility] Preparing for Surgery and or MRSA Information   ___ Take anti-hypertensive (unless it includes a diuretic), cardiac, seizure, asthma,     anti-reflux and psychiatric medicines. These include:  1.   2.  3.  4.  5.  6.  ____Fleets enema or Magnesium Citrate as directed.   _x___ Use CHG Soap or sage wipes as directed on instruction  sheet   ____ Use inhalers on the day of surgery and bring to hospital day of surgery  ____ Stop Metformin and Janumet 2 days prior to surgery.    ____ Take 1/2 of usual insulin dose the night before surgery and none on the morning surgery    _x___ Follow recommendations from Cardiologist, Pulmonologist or PCP regarding          stopping Aspirin, Coumadin, Pllavix ,Eliquis, Effient, or Pradaxa, and Pletal.  X____Stop Anti-inflammatories such as Advil, Aleve, Ibuprofen, Motrin, Naproxen, Naprosyn, Goodies powders or aspirin products. OK to take Tylenol                            _x___ Stop supplements until after surgery.  But may continue Vitamin D, Vitamin B, and multivitamin        ____ Bring C-Pap to the hospital.

## 2016-05-25 ENCOUNTER — Inpatient Hospital Stay
Admission: RE | Admit: 2016-05-25 | Discharge: 2016-05-27 | DRG: 766 | Disposition: A | Discharge status: Home / Self Care | Payer: PRIVATE HEALTH INSURANCE | Source: Ambulatory Visit | Attending: Obstetrics & Gynecology | Admitting: Obstetrics & Gynecology

## 2016-05-25 ENCOUNTER — Encounter
Admission: RE | Disposition: A | Discharge status: Home / Self Care | Payer: Self-pay | Source: Ambulatory Visit | Attending: Obstetrics & Gynecology

## 2016-05-25 ENCOUNTER — Inpatient Hospital Stay: Payer: PRIVATE HEALTH INSURANCE | Admitting: Certified Registered Nurse Anesthetist

## 2016-05-25 DIAGNOSIS — F410 Panic disorder [episodic paroxysmal anxiety] without agoraphobia: Secondary | ICD-10-CM | POA: Diagnosis not present

## 2016-05-25 DIAGNOSIS — R030 Elevated blood-pressure reading, without diagnosis of hypertension: Secondary | ICD-10-CM | POA: Diagnosis present

## 2016-05-25 DIAGNOSIS — O321XX0 Maternal care for breech presentation, not applicable or unspecified: Secondary | ICD-10-CM | POA: Diagnosis present

## 2016-05-25 DIAGNOSIS — K219 Gastro-esophageal reflux disease without esophagitis: Secondary | ICD-10-CM | POA: Diagnosis present

## 2016-05-25 DIAGNOSIS — Z6791 Unspecified blood type, Rh negative: Secondary | ICD-10-CM

## 2016-05-25 DIAGNOSIS — F064 Anxiety disorder due to known physiological condition: Secondary | ICD-10-CM | POA: Diagnosis present

## 2016-05-25 DIAGNOSIS — O09523 Supervision of elderly multigravida, third trimester: Secondary | ICD-10-CM | POA: Diagnosis present

## 2016-05-25 DIAGNOSIS — Z302 Encounter for sterilization: Secondary | ICD-10-CM

## 2016-05-25 DIAGNOSIS — Z98891 History of uterine scar from previous surgery: Secondary | ICD-10-CM

## 2016-05-25 DIAGNOSIS — Z3A37 37 weeks gestation of pregnancy: Secondary | ICD-10-CM | POA: Diagnosis not present

## 2016-05-25 DIAGNOSIS — O34211 Maternal care for low transverse scar from previous cesarean delivery: Principal | ICD-10-CM | POA: Diagnosis present

## 2016-05-25 DIAGNOSIS — O9902 Anemia complicating childbirth: Secondary | ICD-10-CM | POA: Diagnosis present

## 2016-05-25 DIAGNOSIS — O0990 Supervision of high risk pregnancy, unspecified, unspecified trimester: Secondary | ICD-10-CM

## 2016-05-25 DIAGNOSIS — O9089 Other complications of the puerperium, not elsewhere classified: Secondary | ICD-10-CM | POA: Diagnosis present

## 2016-05-25 DIAGNOSIS — O9962 Diseases of the digestive system complicating childbirth: Secondary | ICD-10-CM | POA: Diagnosis present

## 2016-05-25 DIAGNOSIS — O165 Unspecified maternal hypertension, complicating the puerperium: Secondary | ICD-10-CM

## 2016-05-25 DIAGNOSIS — I10 Essential (primary) hypertension: Secondary | ICD-10-CM

## 2016-05-25 DIAGNOSIS — O99344 Other mental disorders complicating childbirth: Secondary | ICD-10-CM | POA: Diagnosis not present

## 2016-05-25 DIAGNOSIS — O26893 Other specified pregnancy related conditions, third trimester: Secondary | ICD-10-CM | POA: Diagnosis present

## 2016-05-25 LAB — RPR: RPR Ser Ql: NONREACTIVE

## 2016-05-25 LAB — ABO/RH: ABO/RH(D): O NEG

## 2016-05-25 SURGERY — Surgical Case
Anesthesia: Spinal | Site: Abdomen | Laterality: Bilateral | Wound class: Clean Contaminated

## 2016-05-25 MED ORDER — ONDANSETRON HCL 4 MG/2ML IJ SOLN
INTRAMUSCULAR | Status: DC | PRN
  Administered 2016-05-25: 8 mg via INTRAVENOUS

## 2016-05-25 MED ORDER — FENTANYL CITRATE (PF) 100 MCG/2ML IJ SOLN
INTRAMUSCULAR | Status: AC
  Filled 2016-05-25: qty 2

## 2016-05-25 MED ORDER — FENTANYL CITRATE (PF) 100 MCG/2ML IJ SOLN: 25.0000 ug | INTRAMUSCULAR | Status: DC | PRN

## 2016-05-25 MED ORDER — OXYCODONE-ACETAMINOPHEN 5-325 MG PO TABS: 2.0000 | ORAL_TABLET | ORAL | Status: DC | PRN

## 2016-05-25 MED ORDER — LACTATED RINGERS IV SOLN
INTRAVENOUS | Status: DC
  Administered 2016-05-25: 125 mL/h via INTRAVENOUS

## 2016-05-25 MED ORDER — MORPHINE SULFATE (PF) 0.5 MG/ML IJ SOLN
INTRAMUSCULAR | Status: AC
  Filled 2016-05-25: qty 10

## 2016-05-25 MED ORDER — ONDANSETRON HCL 4 MG/2ML IJ SOLN
4.0000 mg | Freq: Three times a day (TID) | INTRAMUSCULAR | Status: DC | PRN
  Filled 2016-05-25: qty 2

## 2016-05-25 MED ORDER — MIDAZOLAM HCL 2 MG/2ML IJ SOLN
INTRAMUSCULAR | Status: AC
  Filled 2016-05-25: qty 2

## 2016-05-25 MED ORDER — OXYTOCIN 10 UNIT/ML IJ SOLN
INTRAMUSCULAR | Status: AC
  Filled 2016-05-25: qty 4

## 2016-05-25 MED ORDER — OXYTOCIN 40 UNITS IN LACTATED RINGERS INFUSION - SIMPLE MED
2.5000 [IU]/h | INTRAVENOUS | Status: AC
  Administered 2016-05-25: 2.5 [IU]/h via INTRAVENOUS
  Filled 2016-05-25 (×2): qty 1000

## 2016-05-25 MED ORDER — FENTANYL CITRATE (PF) 100 MCG/2ML IJ SOLN
INTRAMUSCULAR | Status: DC | PRN
  Administered 2016-05-25: 20 ug via INTRATHECAL
  Administered 2016-05-25: 50 ug via INTRAVENOUS

## 2016-05-25 MED ORDER — DIPHENHYDRAMINE HCL 25 MG PO CAPS: 25.0000 mg | ORAL_CAPSULE | Freq: Four times a day (QID) | ORAL | Status: DC | PRN

## 2016-05-25 MED ORDER — LACTATED RINGERS IV SOLN: INTRAVENOUS | Status: DC

## 2016-05-25 MED ORDER — ACETAMINOPHEN 325 MG PO TABS
650.0000 mg | ORAL_TABLET | ORAL | Status: DC | PRN
  Administered 2016-05-27: 650 mg via ORAL
  Filled 2016-05-25 (×2): qty 2

## 2016-05-25 MED ORDER — ZOLPIDEM TARTRATE 5 MG PO TABS: 5.0000 mg | ORAL_TABLET | Freq: Every evening | ORAL | Status: DC | PRN

## 2016-05-25 MED ORDER — OXYTOCIN 40 UNITS IN LACTATED RINGERS INFUSION - SIMPLE MED
INTRAVENOUS | Status: DC | PRN
  Administered 2016-05-25: 800 mL via INTRAVENOUS

## 2016-05-25 MED ORDER — NALBUPHINE HCL 10 MG/ML IJ SOLN: 5.0000 mg | INTRAMUSCULAR | Status: DC | PRN

## 2016-05-25 MED ORDER — SENNOSIDES-DOCUSATE SODIUM 8.6-50 MG PO TABS
2.0000 | ORAL_TABLET | ORAL | Status: DC
  Administered 2016-05-26 (×2): 2 via ORAL
  Filled 2016-05-25 (×2): qty 2

## 2016-05-25 MED ORDER — DIPHENHYDRAMINE HCL 50 MG/ML IJ SOLN
INTRAMUSCULAR | Status: DC | PRN
  Administered 2016-05-25: 25 mg via INTRAVENOUS

## 2016-05-25 MED ORDER — SIMETHICONE 80 MG PO CHEW: 80.0000 mg | CHEWABLE_TABLET | ORAL | Status: DC | PRN

## 2016-05-25 MED ORDER — COCONUT OIL OIL
1.0000 "application " | TOPICAL_OIL | Status: DC | PRN
  Filled 2016-05-25: qty 120

## 2016-05-25 MED ORDER — SCOPOLAMINE 1 MG/3DAYS TD PT72: 1.0000 | MEDICATED_PATCH | Freq: Once | TRANSDERMAL | Status: DC

## 2016-05-25 MED ORDER — BUPIVACAINE IN DEXTROSE 0.75-8.25 % IT SOLN
INTRATHECAL | Status: DC | PRN
  Administered 2016-05-25: 1.6 mL via INTRATHECAL

## 2016-05-25 MED ORDER — CEFAZOLIN SODIUM-DEXTROSE 2-4 GM/100ML-% IV SOLN
2.0000 g | Freq: Once | INTRAVENOUS | Status: DC
  Filled 2016-05-25: qty 100

## 2016-05-25 MED ORDER — DIPHENHYDRAMINE HCL 50 MG/ML IJ SOLN
12.5000 mg | INTRAMUSCULAR | Status: DC | PRN
Start: 2016-05-25 — End: 2016-05-27

## 2016-05-25 MED ORDER — DIBUCAINE 1 % RE OINT: 1.0000 "application " | TOPICAL_OINTMENT | RECTAL | Status: DC | PRN

## 2016-05-25 MED ORDER — MIDAZOLAM HCL 2 MG/2ML IJ SOLN
INTRAMUSCULAR | Status: AC
Start: 2016-05-25 — End: 2016-05-25
  Filled 2016-05-25: qty 2

## 2016-05-25 MED ORDER — ONDANSETRON HCL 4 MG/2ML IJ SOLN
INTRAMUSCULAR | Status: AC
  Filled 2016-05-25: qty 4

## 2016-05-25 MED ORDER — OXYCODONE-ACETAMINOPHEN 5-325 MG PO TABS: 1.0000 | ORAL_TABLET | ORAL | Status: DC | PRN

## 2016-05-25 MED ORDER — BUPIVACAINE HCL (PF) 0.5 % IJ SOLN
INTRAMUSCULAR | Status: DC | PRN
  Administered 2016-05-25: 10 mL

## 2016-05-25 MED ORDER — BUPIVACAINE 0.25 % ON-Q PUMP DUAL CATH 400 ML: 400.0000 mL | INJECTION | Status: DC

## 2016-05-25 MED ORDER — MIDAZOLAM HCL 2 MG/2ML IJ SOLN
INTRAMUSCULAR | Status: DC | PRN
  Administered 2016-05-25: 1 mg via INTRAVENOUS

## 2016-05-25 MED ORDER — DIPHENHYDRAMINE HCL 25 MG PO CAPS
25.0000 mg | ORAL_CAPSULE | ORAL | Status: DC | PRN
  Administered 2016-05-25 – 2016-05-26 (×4): 25 mg via ORAL
  Filled 2016-05-25 (×4): qty 1

## 2016-05-25 MED ORDER — KETOROLAC TROMETHAMINE 30 MG/ML IJ SOLN
30.0000 mg | Freq: Four times a day (QID) | INTRAMUSCULAR | Status: AC
  Administered 2016-05-25 – 2016-05-26 (×3): 30 mg via INTRAVENOUS
  Filled 2016-05-25 (×6): qty 1

## 2016-05-25 MED ORDER — NALOXONE HCL 0.4 MG/ML IJ SOLN: 0.4000 mg | INTRAMUSCULAR | Status: DC | PRN

## 2016-05-25 MED ORDER — SODIUM CHLORIDE 0.9% FLUSH: 3.0000 mL | INTRAVENOUS | Status: DC | PRN

## 2016-05-25 MED ORDER — NALOXONE HCL 2 MG/2ML IJ SOSY
1.0000 ug/kg/h | PREFILLED_SYRINGE | INTRAMUSCULAR | Status: DC | PRN
  Filled 2016-05-25: qty 2

## 2016-05-25 MED ORDER — DIPHENHYDRAMINE HCL 50 MG/ML IJ SOLN
INTRAMUSCULAR | Status: AC
  Filled 2016-05-25: qty 1

## 2016-05-25 MED ORDER — BUPIVACAINE 0.25 % ON-Q PUMP DUAL CATH 400 ML
400.0000 mL | INJECTION | Status: DC
  Filled 2016-05-25: qty 400

## 2016-05-25 MED ORDER — EPHEDRINE SULFATE 50 MG/ML IJ SOLN
INTRAMUSCULAR | Status: AC
  Filled 2016-05-25: qty 1

## 2016-05-25 MED ORDER — SOD CITRATE-CITRIC ACID 500-334 MG/5ML PO SOLN
30.0000 mL | ORAL | Status: AC
  Administered 2016-05-25: 30 mL via ORAL
  Filled 2016-05-25: qty 15

## 2016-05-25 MED ORDER — MORPHINE SULFATE (PF) 2 MG/ML IV SOLN: 1.0000 mg | INTRAVENOUS | Status: DC | PRN

## 2016-05-25 MED ORDER — MEPERIDINE HCL 25 MG/ML IJ SOLN: 6.2500 mg | INTRAMUSCULAR | Status: DC | PRN

## 2016-05-25 MED ORDER — BUPIVACAINE HCL (PF) 0.5 % IJ SOLN: 10.0000 mL | Freq: Once | INTRAMUSCULAR | Status: DC

## 2016-05-25 MED ORDER — CEFAZOLIN SODIUM-DEXTROSE 2-4 GM/100ML-% IV SOLN
2.0000 g | INTRAVENOUS | Status: AC
  Administered 2016-05-25: 2 g via INTRAVENOUS
  Filled 2016-05-25: qty 100

## 2016-05-25 MED ORDER — FENTANYL CITRATE (PF) 250 MCG/5ML IJ SOLN
INTRAMUSCULAR | Status: AC
  Filled 2016-05-25: qty 5

## 2016-05-25 MED ORDER — BUPIVACAINE HCL (PF) 0.5 % IJ SOLN: 5.0000 mL | Freq: Once | INTRAMUSCULAR | Status: DC

## 2016-05-25 MED ORDER — PROPOFOL 10 MG/ML IV BOLUS
INTRAVENOUS | Status: AC
  Filled 2016-05-25: qty 20

## 2016-05-25 MED ORDER — NALBUPHINE HCL 10 MG/ML IJ SOLN: 5.0000 mg | Freq: Once | INTRAMUSCULAR | Status: DC | PRN

## 2016-05-25 MED ORDER — MENTHOL 3 MG MT LOZG
1.0000 | LOZENGE | OROMUCOSAL | Status: DC | PRN
  Filled 2016-05-25: qty 9

## 2016-05-25 MED ORDER — SIMETHICONE 80 MG PO CHEW
80.0000 mg | CHEWABLE_TABLET | ORAL | Status: DC
  Administered 2016-05-26 (×2): 80 mg via ORAL
  Filled 2016-05-25: qty 1

## 2016-05-25 MED ORDER — ONDANSETRON HCL 4 MG/2ML IJ SOLN: 4.0000 mg | Freq: Once | INTRAMUSCULAR | Status: DC | PRN

## 2016-05-25 MED ORDER — NALBUPHINE HCL 10 MG/ML IJ SOLN
5.0000 mg | INTRAMUSCULAR | Status: DC | PRN
Start: 2016-05-25 — End: 2016-05-27

## 2016-05-25 MED ORDER — PRENATAL MULTIVITAMIN CH
1.0000 | ORAL_TABLET | Freq: Every day | ORAL | Status: DC
  Administered 2016-05-26 – 2016-05-27 (×2): 1 via ORAL
  Filled 2016-05-25 (×2): qty 1

## 2016-05-25 MED ORDER — EPHEDRINE SULFATE-NACL 50-0.9 MG/10ML-% IV SOSY
PREFILLED_SYRINGE | INTRAVENOUS | Status: DC | PRN
  Administered 2016-05-25: 5 mg via INTRAVENOUS

## 2016-05-25 MED ORDER — BUPIVACAINE HCL (PF) 0.5 % IJ SOLN
INTRAMUSCULAR | Status: AC
  Filled 2016-05-25: qty 30

## 2016-05-25 MED ORDER — DEXTROSE 5 % IV SOLN
2.0000 g | Freq: Once | INTRAVENOUS | Status: DC
  Filled 2016-05-25: qty 2000

## 2016-05-25 MED ORDER — BUPIVACAINE IN DEXTROSE 0.75-8.25 % IT SOLN
INTRATHECAL | Status: AC
  Filled 2016-05-25: qty 2

## 2016-05-25 MED ORDER — MORPHINE SULFATE (PF) 0.5 MG/ML IJ SOLN
INTRAMUSCULAR | Status: DC | PRN
  Administered 2016-05-25: .2 mg via INTRATHECAL

## 2016-05-25 MED ORDER — PHENYLEPHRINE HCL 10 MG/ML IJ SOLN
INTRAMUSCULAR | Status: DC | PRN
  Administered 2016-05-25: 100 ug via INTRAVENOUS

## 2016-05-25 MED ORDER — WITCH HAZEL-GLYCERIN EX PADS: 1.0000 "application " | MEDICATED_PAD | CUTANEOUS | Status: DC | PRN

## 2016-05-25 MED ORDER — IBUPROFEN 600 MG PO TABS
600.0000 mg | ORAL_TABLET | Freq: Four times a day (QID) | ORAL | Status: DC | PRN
  Administered 2016-05-26 – 2016-05-27 (×4): 600 mg via ORAL
  Filled 2016-05-25 (×4): qty 1

## 2016-05-25 MED ORDER — SIMETHICONE 80 MG PO CHEW
80.0000 mg | CHEWABLE_TABLET | Freq: Three times a day (TID) | ORAL | Status: DC
  Administered 2016-05-26 – 2016-05-27 (×4): 80 mg via ORAL
  Filled 2016-05-25 (×5): qty 1

## 2016-05-25 MED ORDER — KETOROLAC TROMETHAMINE 30 MG/ML IJ SOLN
INTRAMUSCULAR | Status: AC
  Administered 2016-05-25: 30 mg via INTRAVENOUS
  Filled 2016-05-25: qty 1

## 2016-05-25 MED ORDER — DEXTROSE 5 % IV SOLN
2.0000 g | INTRAVENOUS | Status: DC
  Filled 2016-05-25: qty 2000

## 2016-05-25 MED ORDER — LACTATED RINGERS IV SOLN
Freq: Once | INTRAVENOUS | Status: AC
  Administered 2016-05-25: 10:00:00 via INTRAVENOUS

## 2016-05-25 SURGICAL SUPPLY — 25 items
CANISTER SUCT 3000ML PPV (MISCELLANEOUS) ×3 IMPLANT
CATH KIT ON-Q SILVERSOAK 5IN (CATHETERS) ×6 IMPLANT
CHLORAPREP W/TINT 26ML (MISCELLANEOUS) ×6 IMPLANT
DERMABOND ADVANCED (GAUZE/BANDAGES/DRESSINGS) ×2
DERMABOND ADVANCED .7 DNX12 (GAUZE/BANDAGES/DRESSINGS) ×1 IMPLANT
DRSG OPSITE POSTOP 4X10 (GAUZE/BANDAGES/DRESSINGS) ×3 IMPLANT
ELECT CAUTERY BLADE 6.4 (BLADE) ×3 IMPLANT
ELECT REM PT RETURN 9FT ADLT (ELECTROSURGICAL) ×3
ELECTRODE REM PT RTRN 9FT ADLT (ELECTROSURGICAL) ×1 IMPLANT
GLOVE SKINSENSE NS SZ8.0 LF (GLOVE) ×2
GLOVE SKINSENSE STRL SZ8.0 LF (GLOVE) ×1 IMPLANT
GOWN STRL REUS W/ TWL LRG LVL3 (GOWN DISPOSABLE) ×1 IMPLANT
GOWN STRL REUS W/ TWL XL LVL3 (GOWN DISPOSABLE) ×2 IMPLANT
GOWN STRL REUS W/TWL LRG LVL3 (GOWN DISPOSABLE) ×2
GOWN STRL REUS W/TWL XL LVL3 (GOWN DISPOSABLE) ×4
NS IRRIG 1000ML POUR BTL (IV SOLUTION) ×3 IMPLANT
PACK C SECTION AR (MISCELLANEOUS) ×3 IMPLANT
PAD OB MATERNITY 4.3X12.25 (PERSONAL CARE ITEMS) ×3 IMPLANT
PAD PREP 24X41 OB/GYN DISP (PERSONAL CARE ITEMS) ×3 IMPLANT
SUT MAXON ABS #0 GS21 30IN (SUTURE) ×6 IMPLANT
SUT VIC AB 1 CT1 36 (SUTURE) ×15 IMPLANT
SUT VIC AB 2-0 CT1 (SUTURE) ×6 IMPLANT
SUT VIC AB 2-0 CT1 36 (SUTURE) ×3 IMPLANT
SUT VIC AB 4-0 FS2 27 (SUTURE) ×3 IMPLANT
SUT VICRYL 3-0 27IN SH (SUTURE) ×3 IMPLANT

## 2016-05-25 NOTE — Anesthesia Preprocedure Evaluation (Signed)
Anesthesia Evaluation  Patient identified by MRN, date of birth, ID band Patient awake    Reviewed: Allergy & Precautions, NPO status , Patient's Chart, lab work & pertinent test results  History of Anesthesia Complications Negative for: history of anesthetic complications  Airway Mallampati: II       Dental   Pulmonary neg pulmonary ROS,           Cardiovascular negative cardio ROS       Neuro/Psych Anxiety    GI/Hepatic Neg liver ROS, GERD  Medicated and Poorly Controlled,  Endo/Other  negative endocrine ROS  Renal/GU negative Renal ROS     Musculoskeletal   Abdominal   Peds  Hematology   Anesthesia Other Findings   Reproductive/Obstetrics                             Anesthesia Physical Anesthesia Plan  ASA: II  Anesthesia Plan: Spinal   Post-op Pain Management:    Induction:   Airway Management Planned:   Additional Equipment:   Intra-op Plan:   Post-operative Plan:   Informed Consent: I have reviewed the patients History and Physical, chart, labs and discussed the procedure including the risks, benefits and alternatives for the proposed anesthesia with the patient or authorized representative who has indicated his/her understanding and acceptance.     Plan Discussed with:   Anesthesia Plan Comments:         Anesthesia Quick Evaluation

## 2016-05-25 NOTE — Anesthesia Postprocedure Evaluation (Signed)
Anesthesia Post Note  Patient: Denise Clarke  Procedure(s) Performed: Procedure(s) (LRB): REPEAT CESAREAN SECTION WITH BILATERAL TUBAL LIGATION (Bilateral)  Patient location during evaluation: PACU Anesthesia Type: Spinal Level of consciousness: awake and alert Pain management: pain level controlled Vital Signs Assessment: post-procedure vital signs reviewed and stable Respiratory status: spontaneous breathing, nonlabored ventilation, respiratory function stable and patient connected to nasal cannula oxygen Cardiovascular status: blood pressure returned to baseline and stable Postop Assessment: no signs of nausea or vomiting Anesthetic complications: no     Last Vitals:  Vitals:   05/25/16 1020 05/25/16 1652  BP: 130/89 128/68  Pulse: 83 95  Resp: 18   Temp:  36.4 C    Last Pain:  Vitals:   05/25/16 1652  TempSrc: Tympanic  PainSc:                  Molli Barrows

## 2016-05-25 NOTE — Transfer of Care (Signed)
Immediate Anesthesia Transfer of Care Note  Patient: Denise Clarke  Procedure(s) Performed: Procedure(s) with comments: REPEAT CESAREAN SECTION WITH BILATERAL TUBAL LIGATION (Bilateral) - Female @ 1550 Apgars: 8/9 Weight: 7lb  2oz  Patient Location: PACU  Anesthesia Type:Spinal  Level of Consciousness: awake, alert , oriented and patient cooperative  Airway & Oxygen Therapy: Patient Spontanous Breathing  Post-op Assessment: Report given to RN and Post -op Vital signs reviewed and stable  Post vital signs: Reviewed and stable  Last Vitals:  Vitals:   05/25/16 1020 05/25/16 1652  BP: 130/89 128/68  Pulse: 83 95  Resp: 18   Temp:  36.4 C    Last Pain:  Vitals:   05/25/16 1652  TempSrc: Tympanic  PainSc:          Complications: No apparent anesthesia complications

## 2016-05-25 NOTE — Progress Notes (Signed)
Vital signs WNL for previous assessments @ 120s-140s/70-80s.

## 2016-05-25 NOTE — Anesthesia Procedure Notes (Signed)
Spinal  Patient location during procedure: OR Staffing Performed: anesthesiologist  Preanesthetic Checklist Completed: patient identified, site marked, surgical consent, pre-op evaluation, timeout performed, IV checked, risks and benefits discussed and monitors and equipment checked Spinal Block Patient position: sitting Prep: Betadine Patient monitoring: heart rate, continuous pulse ox, blood pressure and cardiac monitor Approach: midline Location: L4-5 Injection technique: single-shot Needle Needle type: Whitacre and Introducer  Needle gauge: 24 G Needle length: 9 cm Assessment Sensory level: T4 Events: paresthesia Additional Notes Transient Left leg paresthesia with spontaneous resolution. Negative blood return. Positive free-flowing CSF. Expiration date of kit checked and confirmed. Patient tolerated procedure well, without complications.

## 2016-05-25 NOTE — H&P (Signed)
History and Physical Interval Note:  05/25/2016 10:18 AM  Denise Clarke  has presented today for surgery, with the diagnosis of [place]  The various methods of treatment have been discussed with the patient and family. After consideration of risks, benefits and other options for treatment, the patient has consented to  Procedure(s): Zearing (Bilateral) as a surgical intervention .  The patient's history has been reviewed, patient examined, no change in status, stable for surgery.  Pt has the following beta blocker history-  Not taking Beta Blocker.  I have reviewed the patient's chart and labs.  Questions were answered to the patient's satisfaction.       Denise Clarke

## 2016-05-25 NOTE — Op Note (Signed)
Cesarean Section Procedure Note Indications: prior cesarean section and term intrauterine pregnancy, Desire for permanent sterility  Pre-operative Diagnosis: Intrauterine pregnancy [redacted]w[redacted]d ;  prior cesarean section and term intrauterine pregnancy, Desire for permanent sterility Post-operative Diagnosis: same, delivered. Procedure: Low Transverse Cesarean Section, Bilateral Tubal Ligation Surgeon: Barnett Applebaum, MD Assistant(s): A Smith Anesthesia: Spinal anesthesia Estimated Blood YOVZ:858  Complications: None; patient tolerated the procedure well. Disposition: PACU - hemodynamically stable. Condition: stable  Findings: A female infant in the breech  presentation. Amniotic fluid - Clear  Birth weight 7-2 lbs  Apgars of 8 and 9.  Intact placenta with a three-vessel cord. Grossly normal uterus, tubes and ovaries bilaterally. no intraabdominal adhesions were noted.  Procedure Details   The patient was taken to Operating Room, identified as the correct patient and the procedure verified as C-Section Delivery. A Time Out was held and the above information confirmed. After induction of anesthesia, the patient was draped and prepped in the usual sterile manner. A Pfannenstiel incision was made and carried down through the subcutaneous tissue to the fascia. Fascial incision was made and extended transversely with the Mayo scissors. The fascia was separated from the underlying rectus tissue superiorly and inferiorly. The peritoneum was identified and entered bluntly. Peritoneal incision was extended longitudinally. The utero-vesical peritoneal reflection was incised transversely and a bladder flap was created digitally.  A low transverse hysterotomy was made. The fetus was delivered atraumatically. The umbilical cord was clamped x2 and cut and the infant was handed to the awaiting pediatricians. The placenta was removed intact and appeared normal with a 3-vessel cord.  The uterus was exteriorized and  cleared of all clot and debris. The hysterotomy was closed with running sutures of 0 Vicryl suture. A second imbricating layer was placed with the same suture. Excellent hemostasis was observed.   The left Fallopian tube was identified, grasped with the Babcock clamps, lifted to the skin incision and followed out distally to the fimbriae. An avascular midsection of the tube approximately 3-4cm from the cornua was grasped with the babcock clamps and brought into a knuckle at the skin incision. The tube was double ligated with 2-0 Vicryl suture and the intervening portion of tube was transected and removed. Excellent hemostasis was noted and the tube was returned to the abdomen. Attention was then turned to the right fallopian tube after confirmation of identification by tracing the tube out to the fimbriae. The same procedure was then performed on the right Fallopian tube. Again, excellent hemostasis was noted at the end of the procedure.  The uterus was returned to the abdomen. The pelvis was irrigated and again, excellent hemostasis was noted.  The On Q Pain pump System was then placed.  Trocars were placed through the abdominal wall into the subfascial space and these were used to thread the silver soaker cathaters into place.The rectus fascia was then reapproximated with running sutures of Maxon, with careful placement not to incorporate the cathaters. Subcutaneous tissues are then irrigated with saline and hemostasis assured.  Skin is then closed with 4-0 vicryl suture in a subcuticular fashion followed by skin adhesive. The cathaters are flushed each with 5 mL of Bupivicaine and stabilized into place with dressing. Instrument, sponge, and needle counts were correct prior to the abdominal closure and at the conclusion of the case.  The patient tolerated the procedure well and was transferred to the recovery room in stable condition.

## 2016-05-25 NOTE — Discharge Summary (Addendum)
Obstetrical Discharge Summary  Date of Admission: 05/25/2016 Date of Discharge: 05/27/2016  Discharge Diagnosis: Term Pregnancy-delivered and prior classical cesarean, elevated blood pressure Primary OB:  [facility]   Gestational Age at Delivery: [redacted]w[redacted]d Antepartum complications: none Date of Delivery: 05/27/2016  Delivered By: PBarnett Applebaum MD Delivery Type: repeat cesarean section, low transverse incision Intrapartum complications/course: None Anesthesia: spinal Placenta: manual removal Laceration: n/a Episiotomy: none Live born female  Birth Weight: 7 lb 1.6 oz (3220 g) APGAR: 8, 9   Post partum course: Since the delivery, patient has tolerate activity, diet, and daily functions without difficulty or complication.  Min lochia.  No breast concerns at this time.  No signs of depression currently. She did have some elevated blood pressures late POD#1 and early POD#2. She had no HA, visual changes, and RUQ pain.  Her blood pressures were more normal at the time of discharge (130s/80s) without any symptoms of preeclampsia.  Her exam was normal and she greatly desired discharge. She was given instructions and precautions for worsening blood pressures and/or symptoms of preeclampsia.   BP 138/85   Pulse 75   Temp 98 F (36.7 C) (Oral)   Resp 20   Ht 5' 4"  (1.626 m)   Wt 160 lb (72.6 kg)   LMP 09/07/2015   SpO2 99%   Breastfeeding? Unknown   BMI 27.46 kg/m   Postpartum Exam:General appearance: alert, cooperative and no distress GI: soft, non-tender; bowel sounds normal; no masses,  no organomegaly and Fundus firm Extremities: extremities normal, atraumatic, no cyanosis or edema Incision: clean, dry, intact, without erythema, induration, warmth, and tenderness  Disposition: home with infant Rh Immune globulin given: yes Rubella vaccine given: no Varicella vaccine given: no Tdap vaccine given in AP or PP setting: given during prenatal care Flu vaccine given in AP or PP setting: given  during prenatal care Contraception: bilateral tubal ligation  Prenatal Labs: O NEG//Rubella Immune//Varicella immune//RPR negative//HIV negative/HepB Surface Ag negative//plans to breastfeed  Plan:  CTANGY DROZDOWSKIwas discharged to home in good condition. [facility]  Discharge Medications: Allergies as of 05/27/2016      Reactions   Sulfa Antibiotics Hives   As a child      Medication List    STOP taking these medications   aspirin EC 81 MG tablet     TAKE these medications   HYDROcodone-acetaminophen 5-325 MG tablet Commonly known as:  NORCO Take 1 tablet by mouth every 6 (six) hours as needed for moderate pain.   ibuprofen 600 MG tablet Commonly known as:  ADVIL,MOTRIN Take 1 tablet (600 mg total) by mouth every 6 (six) hours as needed for mild pain.   prenatal multivitamin Tabs tablet Take 1 tablet by mouth daily.   ranitidine 150 MG tablet Commonly known as:  ZANTAC Take 75 mg by mouth 2 (two) times daily.       Follow-up arrangements:  Follow-up Information    HGae Dry MD. Go in 1 week(s).   Specialty:  Obstetrics and Gynecology Contact information: [phone]         Future Appointments Date Time Provider [place] 06/01/2016 1:20 PM HGae Dry MD [facility] None   SPrentice Docker MD 05/27/2016 5:49 PM    ADDENDUM: I have visually verified the labs from LBaththat shows that the patient is immune to both rubella and varicella.  So, I will discontinue the MMR and Varivax vaccines.  These results were incorrectly transcribed in the system as being NON-IMMUNE.  Prentice Docker, MD 05/27/2016 6:02 PM

## 2016-05-25 NOTE — Anesthesia Post-op Follow-up Note (Cosign Needed)
Anesthesia QCDR form completed.        

## 2016-05-26 ENCOUNTER — Encounter: Payer: Self-pay | Admitting: Obstetrics & Gynecology

## 2016-05-26 LAB — CBC
HEMATOCRIT: 27.4 % — AB (ref 35.0–47.0)
HEMOGLOBIN: 9.2 g/dL — AB (ref 12.0–16.0)
MCH: 27.8 pg (ref 26.0–34.0)
MCHC: 33.5 g/dL (ref 32.0–36.0)
MCV: 83.1 fL (ref 80.0–100.0)
Platelets: 157 10*3/uL (ref 150–440)
RBC: 3.3 MIL/uL — ABNORMAL LOW (ref 3.80–5.20)
RDW: 15.8 % — ABNORMAL HIGH (ref 11.5–14.5)
WBC: 7.2 10*3/uL (ref 3.6–11.0)

## 2016-05-26 LAB — FETAL SCREEN: Fetal Screen: NEGATIVE

## 2016-05-26 MED ORDER — RHO D IMMUNE GLOBULIN 1500 UNIT/2ML IJ SOSY
300.0000 ug | PREFILLED_SYRINGE | Freq: Once | INTRAMUSCULAR | Status: AC
Start: 2016-05-26 — End: 2016-05-26
  Administered 2016-05-26: 300 ug via INTRAVENOUS
  Filled 2016-05-26: qty 2

## 2016-05-26 NOTE — Progress Notes (Signed)
At around 6pm patient reported that she didn't feel right , VSS taken only slightly elevated and patient said she has h/o anxiety but doesn't take anything for it. Patient says she did not sleep last night and has not napped today and also hasn't ate since lunch. So she said she was exhausted.  Patient was crying and shaking for about 15 to 30 min and reported feeling hot.  RN fanned her, used cold rag on forehead and got her some graham crackers and apple juice.  Patient reported felling better, VSS, and planned to have visitors leave so that she could sleep some. Dr Georgianne Fick was [facility] but was in ER delivering a baby during this epsisode.  RN reported to oncoming nurse Alyce and patient stated she just felt exhausted and hungry and feels better after crackers and relaxing with deep breaths.

## 2016-05-26 NOTE — Progress Notes (Signed)
Admit Date: 05/25/2016 Today's Date: 05/26/2016  Subjective: Postpartum Day 1: Cesarean Delivery Patient reports incisional pain and tolerating PO.    Objective: Vital signs in last 24 hours: Temp:  [97.5 F (36.4 C)-98.9 F (37.2 C)] 98.5 F (36.9 C) (05/24 0348) Pulse Rate:  [74-97] 85 (05/24 0348) Resp:  [16-20] 16 (05/24 0348) BP: (121-144)/(62-97) 121/62 (05/24 0348) SpO2:  [97 %-100 %] 98 % (05/24 0348) Weight:  [160 lb (72.6 kg)] 160 lb (72.6 kg) (05/23 1020)  Physical Exam:  General: alert, cooperative and no distress Lochia: appropriate Uterine Fundus: firm Incision: healing well DVT Evaluation: No evidence of DVT seen on physical exam.   Recent Labs  05/24/16 1052 05/26/16 0604  HGB 11.0* 9.2*  HCT 33.0* 27.4*    Assessment/Plan: Status post Cesarean section. Doing well postoperatively.  Continue current care. S/p BTL O- Breast feeding  Denise Clarke 05/26/2016, 7:58 AM

## 2016-05-26 NOTE — Lactation Note (Signed)
This note was copied from a baby's chart. Lactation Consultation Note  Patient Name: Denise Clarke Today's Date: 05/26/2016     Maternal Data   Mother had another infant that spent 2 days in a NICU. Sh is now concerned about volume and really just wants to pump. We discussed a plan for her to breast feed the baby here in the hospital and when her supply is more established she can pump only if that is her plan. The baby is a beautiful breast feeder.We talked a lot about how to know id the bay is getting enough and how having a preterm baby may be impacting her concerns about volume.         Lactation Tools Discussed/Used  Breast Pump   Consult Status      Daryel November 05/26/2016, 3:17 PM

## 2016-05-27 DIAGNOSIS — I10 Essential (primary) hypertension: Secondary | ICD-10-CM

## 2016-05-27 DIAGNOSIS — O165 Unspecified maternal hypertension, complicating the puerperium: Secondary | ICD-10-CM

## 2016-05-27 DIAGNOSIS — Z98891 History of uterine scar from previous surgery: Secondary | ICD-10-CM

## 2016-05-27 LAB — SURGICAL PATHOLOGY

## 2016-05-27 MED ORDER — MEASLES, MUMPS & RUBELLA VAC ~~LOC~~ INJ: 0.5000 mL | INJECTION | SUBCUTANEOUS | Status: DC | PRN

## 2016-05-27 MED ORDER — IBUPROFEN 600 MG PO TABS: 600.0000 mg | ORAL_TABLET | Freq: Four times a day (QID) | ORAL | 0 refills | Status: DC | PRN

## 2016-05-27 MED ORDER — HYDROCODONE-ACETAMINOPHEN 5-325 MG PO TABS: 1.0000 | ORAL_TABLET | Freq: Four times a day (QID) | ORAL | 0 refills | Status: DC | PRN

## 2016-05-27 MED ORDER — VARICELLA VIRUS VACCINE LIVE 1350 PFU/0.5ML IJ SUSR: 0.5000 mL | INTRAMUSCULAR | Status: DC | PRN

## 2016-05-27 NOTE — Progress Notes (Signed)
All discharge instructions given to patient and she voices understanding of all instructions given. Prescriptions given. She is aware of her f/u appt. Patient discharged home with infant in arm escorted out by volunteers.

## 2016-05-27 NOTE — Lactation Note (Signed)
This note was copied from a baby's chart. Lactation Consultation Note  Patient Name: Girl Keta [place] Today's Date: 05/27/2016     Maternal Data    Feeding    LATCH Score/Interventions                      Lactation Tools Discussed/Used     Consult Status  Mom desires to exclusively pump. Instructed mom to purchase 78mm flanges or larger, if nipple size starts to increase.       Marnee Spring 05/27/2016, 2:36 PM

## 2016-05-27 NOTE — Discharge Instructions (Signed)
Follow up sooner with fever greater than 100.5, problems breathing, pain not helped by medications, severe depression ( more than just baby blues, wanting to hurt yourself or the baby), severe bleeding( saturating more than one pad an hour or large palm sized clots),or any incisional concerns such as increased redness, swelling, discharge or increased pain in incision.  No heavy lifting for 6 weeks.  No driving while taking narcotics. No douches, intercourse, tampons, or enemas for 6 weeks. Blood Pressure Monitoring: Measure blood pressure at least twice daily until your follow-up appointment.  If you get a result that has the diastolic blood pressure (top number) that is 150 or greater OR diastolic blood pressure (bottom number) 100 or greater, call the clinic or the on-call provider.  Also, call for headaches, changes in your vision (seeing spots, blurry vision, etc), and for pain on your right side under your rib cage.    Cesarean Delivery, Care After Refer to this sheet in the next few weeks. These instructions provide you with information about caring for yourself after your procedure. Your health care provider may also give you more specific instructions. Your treatment has been planned according to current medical practices, but problems sometimes occur. Call your health care provider if you have any problems or questions after your procedure. What can I expect after the procedure? After the procedure, it is common to have:  A small amount of blood or clear fluid coming from the incision.  Some redness, swelling, and pain in your incision area.  Some abdominal pain and soreness.  Vaginal bleeding (lochia).  Pelvic cramps.  Fatigue. Follow these instructions at home: Incision care    Follow instructions from your health care provider about how to take care of your incision. Make sure you:  Wash your hands with soap and water before you change your bandage (dressing). If soap and water  are not available, use hand sanitizer.  Change your dressing as told by your health care provider.  Leave stitches (sutures), skin staples, skin glue, or adhesive strips in place. These skin closures may need to stay in place for 2 weeks or longer. If adhesive strip edges start to loosen and curl up, you may trim the loose edges. Do not remove adhesive strips completely unless your health care provider tells you to do that.  Check your incision area every day for signs of infection. Check for:  More redness, swelling, or pain.  More fluid or blood.  Warmth.  Pus or a bad smell.  When you cough or sneeze, hug a pillow. This helps with pain and decreases the chance of your incision opening up (dehiscing). Do this until your incision heals. Medicines   Take over-the-counter and prescription medicines only as told by your health care provider.  If you were prescribed an antibiotic medicine, take it as told by your health care provider. Do not stop taking the antibiotic until it is finished. Driving   Do not drive or operate heavy machinery while taking prescription pain medicine.  Do not drive for 24 hours if you received a sedative. Lifestyle   Do not drink alcohol. This is especially important if you are breastfeeding or taking pain medicine.  Do not use tobacco products, including cigarettes, chewing tobacco, or e-cigarettes. If you need help quitting, ask your health care provider. Tobacco can delay wound healing. Eating and drinking   Drink at least 8 eight-ounce glasses of water every day unless told not to by your health care provider.  If you breastfeed, you may need to drink more water than this.  Eat high-fiber foods every day. These foods may help prevent or relieve constipation. High-fiber foods include:  Whole grain cereals and breads.  Brown rice.  Beans.  Fresh fruits and vegetables. Activity   Return to your normal activities as told by your health care  provider. Ask your health care provider what activities are safe for you.  Rest as much as possible. Try to rest or take a nap while your baby is sleeping.  Do not lift anything that is heavier than your baby or 10 lb (4.5 kg) as told by your health care provider.  Ask your health care provider when you can engage in sexual activity. This may depend on your:  Risk of infection.  Healing rate.  Comfort and desire to engage in sexual activity. Bathing   Do not take baths, swim, or use a hot tub until your health care provider approves. Ask your health care provider if you can take showers. You may only be allowed to take sponge baths until your incision heals.  Keep your dressing dry as told by your health care provider. General instructions   Do not use tampons or douches until your health care provider approves.  Wear:  Loose, comfortable clothing.  A supportive and well-fitting bra.  Watch for any blood clots that may pass from your vagina. These may look like clumps of dark red, brown, or black discharge.  Keep your perineum clean and dry as told by your health care provider.  Wipe from front to back when you use the toilet.  If possible, have someone help you care for your baby and help with household activities for a few days after you leave the hospital.  Keep all follow-up visits for you and your baby as told by your health care provider. This is important. Contact a health care provider if:  You have:  Bad-smelling vaginal discharge.  Difficulty urinating.  Pain when urinating.  A sudden increase or decrease in the frequency of your bowel movements.  More redness, swelling, or pain around your incision.  More fluid or blood coming from your incision.  Pus or a bad smell coming from your incision.  A fever.  A rash.  Little or no interest in activities you used to enjoy.  Questions about caring for yourself or your baby.  Nausea.  Your incision  feels warm to the touch.  Your breasts turn red or become painful or hard.  You feel unusually sad or worried.  You vomit.  You pass large blood clots from your vagina. If you pass a blood clot, save it to show to your health care provider. Do not flush blood clots down the toilet without showing your health care provider.  You urinate more than usual.  You are dizzy or light-headed.  You have not breastfed and have not had a menstrual period for 12 weeks after delivery.  You stopped breastfeeding and have not had a menstrual period for 12 weeks after stopping breastfeeding. Get help right away if:  You have:  Pain that does not go away or get better with medicine.  Chest pain.  Difficulty breathing.  Blurred vision or spots in your vision.  Thoughts about hurting yourself or your baby.  New pain in your abdomen or in one of your legs.  A severe headache.  You faint.  You bleed from your vagina so much that you fill two sanitary pads  in one hour. This information is not intended to replace advice given to you by your health care provider. Make sure you discuss any questions you have with your health care provider. Document Released: 09/11/2001 Document Revised: 04/30/2015 Document Reviewed: 11/24/2014 Elsevier Interactive Patient Education  2017 [employer].

## 2016-05-27 NOTE — Progress Notes (Addendum)
Patient ID: SHAKELA DONATI, female   DOB: 1980/03/18, 36 y.o.   MRN: [id number]  Obstetric Postpartum/PostOperative Daily Progress Note Subjective:  36 y.o. [id number] POD#2 status post repeat cesarean delivery and BTL.  She is ambulating, is tolerating po, is voiding spontaneously.  Her pain is well controlled on PO pain medications. Her lochia is less than menses. Denies HA, visual changes, and RUQ pain. She had a moderate panic attack yesterday evening, relieved by conservative measures. She states she has these rarely and does not want medication for them.    Medications SCHEDULED MEDICATIONS  . prenatal multivitamin  1 tablet Oral Q1200  . scopolamine  1 patch Transdermal Once  . senna-docusate  2 tablet Oral Q24H  . simethicone  80 mg Oral TID PC  . simethicone  80 mg Oral Q24H    MEDICATION INFUSIONS  . bupivacaine 0.25 % ON-Q pump DUAL CATH 400 mL    . lactated ringers    . naLOXone [facility]) adult infusion for PRURITIS      PRN MEDICATIONS  acetaminophen, coconut oil, witch hazel-glycerin **AND** dibucaine, diphenhydrAMINE **OR** diphenhydrAMINE, fentaNYL (SUBLIMAZE) injection, ibuprofen, menthol-cetylpyridinium, meperidine (DEMEROL) injection, morphine injection, nalbuphine **OR** nalbuphine, nalbuphine **OR** nalbuphine, naLOXone (NARCAN) adult infusion for PRURITIS, naloxone **AND** sodium chloride flush, ondansetron (ZOFRAN) IV, ondansetron (ZOFRAN) IV, oxyCODONE-acetaminophen, oxyCODONE-acetaminophen, simethicone, zolpidem    Objective:   Vitals:   05/26/16 1814 05/26/16 1913 05/27/16 0438 05/27/16 0809  BP: (!) 144/89 135/86  (!) 140/93  Pulse: (!) 104 77  75  Resp:  18  18  Temp:  98.2 F (36.8 C) 98.3 F (36.8 C) 98.7 F (37.1 C)  TempSrc:  Oral Oral Oral  SpO2:    99%  Weight:      Height:        Current Vital Signs 24h Vital Sign Ranges  T 98.7 F (37.1 C) Temp  Avg: 98.1 F (36.7 C)  Min: 97.5 F (36.4 C)  Max: 98.7 F (37.1 C)  BP (!) 140/93 (nurse C.  Cooper notified) BP  Min: 116/73  Max: 144/89  HR 75 Pulse  Avg: 81.5  Min: 70  Max: 104  RR 18 Resp  Avg: 18.7  Min: 18  Max: 20  SaO2 99 % Not Delivered SpO2  Avg: 99 %  Min: 99 %  Max: 99 %       24 Hour I/O Current Shift I/O  Time Ins Outs 05/24 0701 - 05/25 0700 In: 240 [P.O.:240] Out: 550 [Urine:550] No intake/output data recorded.  General: NAD Pulmonary: CTAB CV: RRR Abdomen: non-distended, non-tender, fundus firm at level of umbilicus Inc: Clean/dry/intact, dressing place, OnQ in place and intact.  Extremities: no edema, no erythema, no tenderness  Labs:   Recent Labs Lab 05/24/16 1052 05/26/16 0604  WBC 7.1 7.2  HGB 11.0* 9.2*  HCT 33.0* 27.4*  PLT 169 157     Assessment:   36 y.o. [id number] postoperative day # 2, s/p repeat cesarean section with BTL, doing well overall with recent mildly elevated BPs. No signs of preeclampsia.  With history of severe preeclampsia necessitating 27 week delivery, will monitor throughout the day with possible discharge home today or tomorrow.   Plan:  1) Acute blood loss anemia - hemodynamically stable and asymptomatic - po ferrous sulfate  2) O NEG / Rubella Immune / varicella Immune   3) TDAP status up to date  4) breast feeding- no issues/Contraception = bilateral tubal ligation  5) elevated BPs: very midly elevated. If  no change or improvement, home later today with strict precautions. She states that she has the ability to monitor BPs at home. In either case, will discharge with strict BP parameters.  No indication for anti-hypertensive at this point.    6) Rh negative: baby rh status is positive - rhogam for mother.   7) Disposition - home later today or tomorrow.   Will Bonnet, MD 05/27/2016 9:23 AM

## 2016-05-28 LAB — RHOGAM INJECTION: Unit division: 0

## 2016-06-01 ENCOUNTER — Ambulatory Visit (INDEPENDENT_AMBULATORY_CARE_PROVIDER_SITE_OTHER): Payer: PRIVATE HEALTH INSURANCE | Admitting: Obstetrics & Gynecology

## 2016-06-01 VITALS — BP 148/92 | HR 84 | Wt 146.0 lb

## 2016-06-01 DIAGNOSIS — Z98891 History of uterine scar from previous surgery: Secondary | ICD-10-CM

## 2016-06-01 MED ORDER — SERTRALINE HCL 50 MG PO TABS: 50.0000 mg | ORAL_TABLET | Freq: Every day | ORAL | 5 refills | Status: DC

## 2016-06-01 NOTE — Patient Instructions (Signed)

## 2016-06-01 NOTE — Progress Notes (Signed)
  Postoperative Follow-up Patient presents post op from CS BTL for h/o classical CS at 37 weeks, 1 week ago. Anxiety has been bad, with elevated BPs at same time. GAD7 scale 14 Subjective: Patient reports marked improvement in her preop symptoms. Eating a regular diet without difficulty. Pain is controlled with current analgesics. Medications being used: ibuprofen (OTC) and narcotic analgesics including oxycodone/acetaminophen (Percocet, Tylox).  Activity: normal activities of daily living. Patient reports vaginal sx's of None  Objective: BP (!) 148/92 (BP Location: Left Arm)   Pulse 84   Wt 146 lb (66.2 kg)   LMP 09/07/2015   BMI 25.06 kg/m  Physical Exam  Constitutional: She is oriented to person, place, and time. She appears well-developed and well-nourished. No distress.  Cardiovascular: Normal rate.   Pulmonary/Chest: Effort normal.  Abdominal: Soft. She exhibits no distension. There is no tenderness.  Incision Healing Well   Musculoskeletal: Normal range of motion.  Neurological: She is alert and oriented to person, place, and time. No cranial nerve deficit.  Skin: Skin is warm and dry.  Psychiatric: She has a normal mood and affect.    Assessment: s/p :  cesarean section and tubal ligation stable  Plan: Patient has done well after surgery with no apparent complications.  I have discussed the post-operative course to date, and the expected progress moving forward.  The patient understands what complications to be concerned about.  I will see the patient in routine follow up, or sooner if needed.   Zoloft for anxiety/mood stabilization Activity plan: No heavy lifting.  Hoyt Koch 06/01/2016, 1:28 PM

## 2016-06-02 ENCOUNTER — Telehealth: Payer: Self-pay

## 2016-06-02 NOTE — Telephone Encounter (Signed)
Pt calling wanting to let [place] know that she woke up this AM with a pain in her right rib. States she did not have the pain when she came for last appt but just wanted [facility] to be aware.

## 2016-06-02 NOTE — Telephone Encounter (Signed)
OK ,Thanks

## 2016-06-03 LAB — TYPE AND SCREEN
ABO/RH(D): O NEG
Antibody Screen: POSITIVE
Extend sample reason: UNDETERMINED
UNIT DIVISION: 0
UNIT DIVISION: 0

## 2016-06-03 LAB — BPAM RBC
BLOOD PRODUCT EXPIRATION DATE: 201805302359
Blood Product Expiration Date: 201805302359
ISSUE DATE / TIME: 201805250836
ISSUE DATE / TIME: 201805241603
UNIT TYPE AND RH: 9500
Unit Type and Rh: 9500

## 2016-06-17 ENCOUNTER — Encounter: Payer: Self-pay | Admitting: Obstetrics & Gynecology

## 2016-07-11 ENCOUNTER — Encounter: Payer: Self-pay | Admitting: Obstetrics & Gynecology

## 2016-07-11 ENCOUNTER — Ambulatory Visit (INDEPENDENT_AMBULATORY_CARE_PROVIDER_SITE_OTHER): Payer: PRIVATE HEALTH INSURANCE | Admitting: Obstetrics & Gynecology

## 2016-07-11 DIAGNOSIS — Z98891 History of uterine scar from previous surgery: Secondary | ICD-10-CM

## 2016-07-11 DIAGNOSIS — F064 Anxiety disorder due to known physiological condition: Secondary | ICD-10-CM

## 2016-07-11 NOTE — Progress Notes (Signed)
  OBSTETRICS POSTPARTUM CLINIC PROGRESS NOTE  Subjective:     Denise Clarke is a 36 y.o. [phone] female who presents for a postpartum visit. She is 6 weeks postpartum following a Term pregnancy and delivery by C-section repeat; no problems after deliver.  I have fully reviewed the prenatal and intrapartum course. Anesthesia: spinal.  Postpartum course has been complicated by uncomplicated.  Baby is feeding by Breast.  Bleeding: patient has not  resumed menses.  Bowel function is normal. Bladder function is normal.  Patient is not sexually active. Contraception method desired is tubal ligation.  Postpartum depression screening: negative. Edinburgh 1.  The following portions of the patient's history were reviewed and updated as appropriate: allergies, current medications, past family history, past medical history, past social history, past surgical history and problem list.  Review of Systems Pertinent items noted in HPI and remainder of comprehensive ROS otherwise negative.  Objective:    BP 128/82   Pulse 68   Ht 5\' 4"  (1.626 m)   Wt 128 lb (58.1 kg)   BMI 21.97 kg/m   General:  alert and no distress   Breasts:  inspection negative, no nipple discharge or bleeding, no masses or nodularity palpable  Lungs: clear to auscultation bilaterally  Heart:  regular rate and rhythm, S1, S2 normal, no murmur, click, rub or gallop  Abdomen: soft, non-tender; bowel sounds normal; no masses,  no organomegaly.  Well healed Pfannenstiel incision   Vulva:  normal  Vagina: normal vagina, no discharge, exudate, lesion, or erythema  Cervix:  no cervical motion tenderness and no lesions  Corpus: normal size, contour, position, consistency, mobility, non-tender  Adnexa:  normal adnexa and no mass, fullness, tenderness  Rectal Exam: Not performed.          Assessment:   1. Cesarean delivery delivered  2. History of cesarean section, classical  3. Anxiety disorder due to known physiological  condition Doing well on Zoloft 50 mg daily Consider taper at a later time   Plan:  Resume all normal activities Follow up in: 4 months or as needed.   Barnett Applebaum, MD, [facility], [facility] 07/11/2016  11:32 AM

## 2016-10-26 ENCOUNTER — Encounter: Payer: Self-pay | Admitting: Obstetrics & Gynecology

## 2016-11-11 ENCOUNTER — Ambulatory Visit (INDEPENDENT_AMBULATORY_CARE_PROVIDER_SITE_OTHER): Payer: PRIVATE HEALTH INSURANCE | Admitting: Obstetrics & Gynecology

## 2016-11-11 ENCOUNTER — Encounter: Payer: Self-pay | Admitting: Obstetrics & Gynecology

## 2016-11-11 VITALS — BP 120/80 | HR 80 | Ht 64.0 in | Wt 130.0 lb

## 2016-11-11 DIAGNOSIS — Z87410 Personal history of cervical dysplasia: Secondary | ICD-10-CM | POA: Insufficient documentation

## 2016-11-11 DIAGNOSIS — Z01419 Encounter for gynecological examination (general) (routine) without abnormal findings: Secondary | ICD-10-CM | POA: Diagnosis not present

## 2016-11-11 DIAGNOSIS — Z0000 Encounter for general adult medical examination without abnormal findings: Secondary | ICD-10-CM

## 2016-11-11 NOTE — Patient Instructions (Signed)
PAP every year Mammogram age 36 Taper off Zoloft

## 2016-11-11 NOTE — Progress Notes (Signed)
HPI:      [name] is a 36 y.o. [id number] who LMP was No LMP recorded., she presents today for her annual examination. The patient has no complaints today. The patient is sexually active. Her last pap: approximate date 10/2015 and was abnormal: ASCUS. The patient does perform self breast exams.  There is no notable family history of breast or ovarian cancer in her family.  The patient has regular exercise: yes.  The patient denies current symptoms of depression.    GYN History: Contraception: tubal ligation  PMHx: Past Medical History:  Diagnosis Date  . AMA (advanced maternal age) multigravida 56+   . Esophageal reflux   . Headache    migraine  . Hx of pre-eclampsia in prior pregnancy, currently pregnant   . Type O blood, Rh negative    Past Surgical History:  Procedure Laterality Date  . BUNIONECTOMY    . CESAREAN SECTION  2013  . LAPAROSCOPY  2007   Family History  Problem Relation Age of Onset  . Diabetes Father   . Hypertension Father   . Cancer Brother        leukemia  . Breast cancer Maternal Grandmother 79   Social History   Tobacco Use  . Smoking status: Never Smoker  . Smokeless tobacco: Never Used  Substance Use Topics  . Alcohol use: No    Alcohol/week: 0.0 oz  . Drug use: No    Current Outpatient Medications:  .  ibuprofen (ADVIL,MOTRIN) 600 MG tablet, Take 1 tablet (600 mg total) by mouth every 6 (six) hours as needed for mild pain., Disp: 30 tablet, Rfl: 0 .  Prenatal Vit-Fe Fumarate-FA (PRENATAL MULTIVITAMIN) TABS tablet, Take 1 tablet by mouth daily. , Disp: , Rfl:  Allergies: Sulfa antibiotics  Review of Systems  Constitutional: Negative for chills, fever and malaise/fatigue.  HENT: Negative for congestion, sinus pain and sore throat.   Eyes: Negative for blurred vision and pain.  Respiratory: Negative for cough and wheezing.   Cardiovascular: Negative for chest pain and leg swelling.  Gastrointestinal: Negative for abdominal  pain, constipation, diarrhea, heartburn, nausea and vomiting.  Genitourinary: Negative for dysuria, frequency, hematuria and urgency.  Musculoskeletal: Negative for back pain, joint pain, myalgias and neck pain.  Skin: Negative for itching and rash.  Neurological: Negative for dizziness, tremors and weakness.  Endo/Heme/Allergies: Does not bruise/bleed easily.  Psychiatric/Behavioral: Negative for depression. The patient is not nervous/anxious and does not have insomnia.     Objective: BP 120/80   Pulse 80   Ht 5\' 4"  (1.626 m)   Wt 130 lb (59 kg)   BMI 22.31 kg/m   Filed Weights   11/11/16 1109  Weight: 130 lb (59 kg)   Body mass index is 22.31 kg/m. Physical Exam  Constitutional: She is oriented to person, place, and time. She appears well-developed and well-nourished. No distress.  Genitourinary: Rectum normal, vagina normal and uterus normal. Pelvic exam was performed with patient supine. There is no rash or lesion on the right labia. There is no rash or lesion on the left labia. Vagina exhibits no lesion. No bleeding in the vagina. Right adnexum does not display mass and does not display tenderness. Left adnexum does not display mass and does not display tenderness. Cervix does not exhibit motion tenderness, lesion, friability or polyp.   Uterus is mobile and midaxial. Uterus is not enlarged or exhibiting a mass.  HENT:  Head: Normocephalic and atraumatic. Head is without laceration.  Right  Ear: Hearing normal.  Left Ear: Hearing normal.  Nose: No epistaxis.  No foreign bodies.  Mouth/Throat: Uvula is midline, oropharynx is clear and moist and mucous membranes are normal.  Eyes: Pupils are equal, round, and reactive to light.  Neck: Normal range of motion. Neck supple. No thyromegaly present.  Cardiovascular: Normal rate and regular rhythm. Exam reveals no gallop and no friction rub.  No murmur heard. Pulmonary/Chest: Effort normal and breath sounds normal. No respiratory  distress. She has no wheezes. Right breast exhibits no mass, no skin change and no tenderness. Left breast exhibits no mass, no skin change and no tenderness.  Abdominal: Soft. Bowel sounds are normal. She exhibits no distension. There is no tenderness. There is no rebound.  Musculoskeletal: Normal range of motion.  Neurological: She is alert and oriented to person, place, and time. No cranial nerve deficit.  Skin: Skin is warm and dry.  Psychiatric: She has a normal mood and affect. Judgment normal.  Vitals reviewed.   Assessment:  ANNUAL EXAM 1. Annual physical exam   2. History of cervical dysplasia      Screening Plan:            1.  Cervical Screening-  Pap smear done today, f/u to ASCUS last year  2. Breast screening- Exam annually and mammogram>40 planned   3. Labs managed by PCP  4. Counseling for contraception: bilateral tubal ligation   5. Taper off of Zoloft     F/U  Return in about 1 year (around 11/11/2017) for Annual.  Barnett Applebaum, MD, [facility], [facility] 11/11/2016  11:38 AM

## 2016-11-15 LAB — IGP, APTIMA HPV
HPV APTIMA: NEGATIVE
PAP Smear Comment: 0

## 2016-12-19 ENCOUNTER — Encounter: Payer: Self-pay | Admitting: Obstetrics & Gynecology

## 2016-12-19 ENCOUNTER — Other Ambulatory Visit: Payer: Self-pay | Admitting: Obstetrics & Gynecology

## 2016-12-19 MED ORDER — SERTRALINE HCL 50 MG PO TABS
25.0000 mg | ORAL_TABLET | Freq: Every day | ORAL | 6 refills | Status: DC
Start: 2016-12-19 — End: 2018-01-11

## 2017-03-24 ENCOUNTER — Ambulatory Visit: Payer: PRIVATE HEALTH INSURANCE | Admitting: Obstetrics & Gynecology

## 2017-06-12 ENCOUNTER — Telehealth: Payer: PRIVATE HEALTH INSURANCE | Admitting: Nurse Practitioner

## 2017-06-12 DIAGNOSIS — R059 Cough, unspecified: Secondary | ICD-10-CM

## 2017-06-12 DIAGNOSIS — R05 Cough: Secondary | ICD-10-CM

## 2017-06-12 MED ORDER — BENZONATATE 100 MG PO CAPS: 100.0000 mg | ORAL_CAPSULE | Freq: Three times a day (TID) | ORAL | 0 refills | Status: DC | PRN

## 2017-06-12 NOTE — Progress Notes (Signed)

## 2017-06-14 ENCOUNTER — Encounter: Payer: Self-pay | Admitting: Family Medicine

## 2017-06-16 ENCOUNTER — Ambulatory Visit: Payer: PRIVATE HEALTH INSURANCE | Admitting: Family Medicine

## 2017-06-16 ENCOUNTER — Encounter: Payer: Self-pay | Admitting: Family Medicine

## 2017-06-16 VITALS — BP 120/86 | HR 80 | Ht 64.0 in | Wt 135.0 lb

## 2017-06-16 DIAGNOSIS — J0100 Acute maxillary sinusitis, unspecified: Secondary | ICD-10-CM

## 2017-06-16 MED ORDER — GUAIFENESIN-CODEINE 100-10 MG/5ML PO SYRP: 5.0000 mL | ORAL_SOLUTION | Freq: Three times a day (TID) | ORAL | 0 refills | Status: DC | PRN

## 2017-06-16 MED ORDER — AZITHROMYCIN 250 MG PO TABS: ORAL_TABLET | ORAL | 0 refills | Status: DC

## 2017-06-16 NOTE — Progress Notes (Signed)
Name: Denise Clarke   MRN: [id number]    DOB: 18-Apr-1980   Date:06/16/2017       Progress Note  Subjective  Chief Complaint  Chief Complaint  Patient presents with  . Sinusitis    had e-visit x 4 days ago- was given tessalon perles only. Still having bad cough with some bloody production, ear and nose congestion    Sinusitis  This is a new problem. The current episode started in the past 7 days. The problem has been gradually worsening since onset. There has been no fever. The pain is mild. Associated symptoms include chills, congestion, coughing, diaphoresis, headaches, shortness of breath and sinus pressure. Pertinent negatives include no ear pain, hoarse voice, neck pain, sneezing, sore throat or swollen glands. Treatments tried: tessalon perles. The treatment provided no relief.    No problem-specific Assessment & Plan notes found for this encounter.   Past Medical History:  Diagnosis Date  . AMA (advanced maternal age) multigravida 34+   . Esophageal reflux   . Headache    migraine  . Hx of pre-eclampsia in prior pregnancy, currently pregnant   . Type O blood, Rh negative     Past Surgical History:  Procedure Laterality Date  . BUNIONECTOMY    . CESAREAN SECTION  2013  . CESAREAN SECTION WITH BILATERAL TUBAL LIGATION Bilateral 05/25/2016   Procedure: REPEAT CESAREAN SECTION WITH BILATERAL TUBAL LIGATION;  Surgeon: Gae Dry, MD;  Location: [facility] ORS;  Service: Obstetrics;  Laterality: Bilateral;  Female @ 1550 Apgars: 8/9 Weight: 7lb  2oz  . LAPAROSCOPY  2007    Family History  Problem Relation Age of Onset  . Diabetes Father   . Hypertension Father   . Cancer Brother        leukemia  . Breast cancer Maternal Grandmother 33    Social History   Socioeconomic History  . Marital status: Married    Spouse name: Not on file  . Number of children: Not on file  . Years of education: Not on file  . Highest education level: Not on file  Occupational  History  . Not on file  Social Needs  . Financial resource strain: Not on file  . Food insecurity:    Worry: Not on file    Inability: Not on file  . Transportation needs:    Medical: Not on file    Non-medical: Not on file  Tobacco Use  . Smoking status: Never Smoker  . Smokeless tobacco: Never Used  Substance and Sexual Activity  . Alcohol use: No    Alcohol/week: 0.0 oz  . Drug use: No  . Sexual activity: Yes    Birth control/protection: Surgical  Lifestyle  . Physical activity:    Days per week: Not on file    Minutes per session: Not on file  . Stress: Not on file  Relationships  . Social connections:    Talks on phone: Not on file    Gets together: Not on file    Attends religious service: Not on file    Active member of club or organization: Not on file    Attends meetings of clubs or organizations: Not on file    Relationship status: Not on file  . Intimate partner violence:    Fear of current or ex partner: Not on file    Emotionally abused: Not on file    Physically abused: Not on file    Forced sexual activity: Not on file  Other  Topics Concern  . Not on file  Social History Narrative  . Not on file    Allergies  Allergen Reactions  . Sulfa Antibiotics Hives    As a child    Outpatient Medications Prior to Visit  Medication Sig Dispense Refill  . benzonatate (TESSALON PERLES) 100 MG capsule Take 1 capsule (100 mg total) by mouth 3 (three) times daily as needed for cough. 20 capsule 0  . sertraline (ZOLOFT) 50 MG tablet Take 0.5 tablets (25 mg total) by mouth daily. 30 tablet 6  . ibuprofen (ADVIL,MOTRIN) 600 MG tablet Take 1 tablet (600 mg total) by mouth every 6 (six) hours as needed for mild pain. 30 tablet 0  . Prenatal Vit-Fe Fumarate-FA (PRENATAL MULTIVITAMIN) TABS tablet Take 1 tablet by mouth daily.      No facility-administered medications prior to visit.     Review of Systems  Constitutional: Positive for chills and diaphoresis. Negative  for fever, malaise/fatigue and weight loss.  HENT: Positive for congestion and sinus pressure. Negative for ear discharge, ear pain, hoarse voice, sneezing and sore throat.   Eyes: Negative for blurred vision.  Respiratory: Positive for cough and shortness of breath. Negative for sputum production and wheezing.   Cardiovascular: Negative for chest pain, palpitations and leg swelling.  Gastrointestinal: Negative for abdominal pain, blood in stool, constipation, diarrhea, heartburn, melena and nausea.  Genitourinary: Negative for dysuria, frequency, hematuria and urgency.  Musculoskeletal: Negative for back pain, joint pain, myalgias and neck pain.  Skin: Negative for rash.  Neurological: Positive for headaches. Negative for dizziness, tingling, sensory change and focal weakness.  Endo/Heme/Allergies: Negative for environmental allergies and polydipsia. Does not bruise/bleed easily.  Psychiatric/Behavioral: Negative for depression and suicidal ideas. The patient is not nervous/anxious and does not have insomnia.      Objective  Vitals:   06/16/17 1130  BP: 120/86  Pulse: 80  Weight: 135 lb (61.2 kg)  Height: 5\' 4"  (1.626 m)    Physical Exam  Constitutional: No distress.  HENT:  Head: Normocephalic and atraumatic.  Right Ear: Hearing, tympanic membrane, external ear and ear canal normal.  Left Ear: Hearing, tympanic membrane, external ear and ear canal normal.  Nose: Mucosal edema present. Right sinus exhibits maxillary sinus tenderness. Right sinus exhibits no frontal sinus tenderness. Left sinus exhibits maxillary sinus tenderness. Left sinus exhibits no frontal sinus tenderness.  Mouth/Throat: Oropharynx is clear and moist. No oropharyngeal exudate, posterior oropharyngeal edema or posterior oropharyngeal erythema.  Postnasal drainage  Eyes: Pupils are equal, round, and reactive to light. Conjunctivae and EOM are normal. Right eye exhibits no discharge. Left eye exhibits no  discharge.  Neck: Normal range of motion. Neck supple. No JVD present. No thyromegaly present.  Cardiovascular: Normal rate, regular rhythm, normal heart sounds and intact distal pulses. Exam reveals no gallop and no friction rub.  No murmur heard. Pulmonary/Chest: Effort normal and breath sounds normal. She has no wheezes. She has no rales.  Abdominal: Soft. Bowel sounds are normal. She exhibits no mass. There is no tenderness. There is no guarding.  Musculoskeletal: Normal range of motion. She exhibits no edema.  Lymphadenopathy:    She has no cervical adenopathy.  Neurological: She is alert. She has normal reflexes.  Skin: Skin is warm and dry. She is not diaphoretic.  Nursing note and vitals reviewed.     Assessment & Plan  Problem List Items Addressed This Visit    None    Visit Diagnoses    Acute non-recurrent  maxillary sinusitis    -  Primary   treat with ZPack   Relevant Medications   azithromycin (ZITHROMAX) 250 MG tablet   guaiFENesin-codeine (ROBITUSSIN AC) 100-10 MG/5ML syrup      Meds ordered this encounter  Medications  . azithromycin (ZITHROMAX) 250 MG tablet    Sig: 2 today then 1 a day for 4 days    Dispense:  6 tablet    Refill:  0  . guaiFENesin-codeine (ROBITUSSIN AC) 100-10 MG/5ML syrup    Sig: Take 5 mLs by mouth 3 (three) times daily as needed for cough.    Dispense:  150 mL    Refill:  0      Dr. Otilio Miu [facility]  06/16/17

## 2017-09-28 ENCOUNTER — Encounter: Payer: Self-pay | Admitting: Obstetrics & Gynecology

## 2017-09-28 NOTE — Telephone Encounter (Signed)
Do you want her to drop off a urine sample and I just check and let you know the results ? She can just be added to the nurses schedule

## 2017-09-28 NOTE — Telephone Encounter (Signed)
Can you add to [facility] schedule call pt to schedule

## 2017-09-28 NOTE — Telephone Encounter (Signed)
Patient added to [facility] schedule 09/29/17 per [facility]

## 2017-09-28 NOTE — Telephone Encounter (Signed)
Sch overbook for 820 tomorrow, thx

## 2017-09-28 NOTE — Telephone Encounter (Signed)
No I will see her

## 2017-09-29 ENCOUNTER — Ambulatory Visit (INDEPENDENT_AMBULATORY_CARE_PROVIDER_SITE_OTHER): Payer: PRIVATE HEALTH INSURANCE | Admitting: Obstetrics & Gynecology

## 2017-09-29 ENCOUNTER — Encounter: Payer: Self-pay | Admitting: Obstetrics & Gynecology

## 2017-09-29 VITALS — BP 142/90 | Ht 64.0 in | Wt 136.0 lb

## 2017-09-29 DIAGNOSIS — R102 Pelvic and perineal pain: Secondary | ICD-10-CM | POA: Diagnosis not present

## 2017-09-29 NOTE — Progress Notes (Signed)
  HPI:      [name] is a 37 y.o. [id number] who LMP was Patient's last menstrual period was 09/08/2017., presents today for a problem visit.    Urinary Tract Infection: Patient complains of frequency and suprapubic pressure . She has had symptoms for 5 days. Patient also complains of no fever or other sx's. Patient denies back pain and vaginal discharge. Patient does not have a history of recurrent UTI.  Patient does not have a history of pyelonephritis. She does have periods q 24 days with pain, ovulatory pain, and menstrual headaches.  Recent HTN as well.  PMHx: She  has a past medical history of AMA (advanced maternal age) multigravida 35+, Esophageal reflux, Headache, pre-eclampsia in prior pregnancy, currently pregnant, and Type O blood, Rh negative. Also,  has a past surgical history that includes Cesarean section (2013); Bunionectomy; laparoscopy (2007); and Cesarean section with bilateral tubal ligation (Bilateral, 05/25/2016)., family history includes Breast cancer (age of onset: 61) in her maternal grandmother; Cancer in her brother; Diabetes in her father; Hypertension in her father.,  reports that she has never smoked. She has never used smokeless tobacco. She reports that she does not drink alcohol or use drugs.  She has a current medication list which includes the following prescription(s): azithromycin, benzonatate, guaifenesin-codeine, and sertraline. Also, is allergic to sulfa antibiotics.  Review of Systems  Constitutional: Negative for chills, fever and malaise/fatigue.  HENT: Negative for congestion, sinus pain and sore throat.   Eyes: Negative for blurred vision and pain.  Respiratory: Negative for cough and wheezing.   Cardiovascular: Negative for chest pain and leg swelling.  Gastrointestinal: Negative for abdominal pain, constipation, diarrhea, heartburn, nausea and vomiting.  Genitourinary: Negative for dysuria, frequency, hematuria and urgency.  Musculoskeletal:  Negative for back pain, joint pain, myalgias and neck pain.  Skin: Negative for itching and rash.  Neurological: Negative for dizziness, tremors and weakness.  Endo/Heme/Allergies: Does not bruise/bleed easily.  Psychiatric/Behavioral: Negative for depression. The patient is not nervous/anxious and does not have insomnia.    Objective: BP (!) 142/90   Ht 5\' 4"  (1.626 m)   Wt 136 lb (61.7 kg)   LMP 09/08/2017   BMI 23.34 kg/m  Physical Exam  Constitutional: She is oriented to person, place, and time. She appears well-developed and well-nourished. No distress.  Musculoskeletal: Normal range of motion.  Neurological: She is alert and oriented to person, place, and time.  Skin: Skin is warm and dry.  Psychiatric: She has a normal mood and affect.  Vitals reviewed. Back- No CVAT  ASSESSMENT/PLAN:    Pelvic pressure in female    -  Primary   Relevant Orders   Urine Culture to assess for occult UTI  May be related to dysmenorrhea or [place]  Options for period control and thus control of sx's disucxssed To follow up for Annual  Barnett Applebaum, MD, [place], [facility] 09/29/2017  8:36 AM

## 2017-10-04 ENCOUNTER — Ambulatory Visit (INDEPENDENT_AMBULATORY_CARE_PROVIDER_SITE_OTHER): Payer: PRIVATE HEALTH INSURANCE | Admitting: Family Medicine

## 2017-10-04 ENCOUNTER — Encounter: Payer: Self-pay | Admitting: Family Medicine

## 2017-10-04 VITALS — BP 140/98 | HR 80 | Ht 64.0 in | Wt 135.0 lb

## 2017-10-04 DIAGNOSIS — N342 Other urethritis: Secondary | ICD-10-CM

## 2017-10-04 DIAGNOSIS — M545 Low back pain, unspecified: Secondary | ICD-10-CM

## 2017-10-04 DIAGNOSIS — R030 Elevated blood-pressure reading, without diagnosis of hypertension: Secondary | ICD-10-CM | POA: Diagnosis not present

## 2017-10-04 LAB — POCT URINALYSIS DIPSTICK
BILIRUBIN UA: NEGATIVE
Glucose, UA: NEGATIVE
KETONES UA: NEGATIVE
NITRITE UA: NEGATIVE
PH UA: 7 (ref 5.0–8.0)
PROTEIN UA: NEGATIVE
Spec Grav, UA: 1.01 (ref 1.010–1.025)
UROBILINOGEN UA: 0.2 U/dL

## 2017-10-04 MED ORDER — CEPHALEXIN 500 MG PO CAPS: 500.0000 mg | ORAL_CAPSULE | Freq: Two times a day (BID) | ORAL | 0 refills | Status: DC

## 2017-10-04 MED ORDER — HYDROCHLOROTHIAZIDE 12.5 MG PO TABS: 12.5000 mg | ORAL_TABLET | Freq: Every day | ORAL | 1 refills | Status: DC

## 2017-10-04 NOTE — Progress Notes (Signed)
Date:  10/04/2017   Name:  Denise Clarke   DOB:  22-Apr-1980   MRN:  [id number]   Chief Complaint: Hypertension (having high readings at work and at last OB visit) and Back Pain (lower back pain R) side)  Hypertension  This is a new problem. The current episode started 1 to 4 weeks ago. The problem has been waxing and waning since onset. The problem is uncontrolled. Associated symptoms include headaches. Pertinent negatives include no anxiety, blurred vision, chest pain, malaise/fatigue, neck pain, orthopnea, palpitations, PND, shortness of breath or sweats. Agents associated with hypertension include NSAIDs. There are no known risk factors for coronary artery disease. Past treatments include nothing. There is no history of angina, kidney disease, CAD/MI, CVA, heart failure, left ventricular hypertrophy, PVD or retinopathy. There is no history of chronic renal disease, a hypertension causing med or renovascular disease.  Back Pain  This is a new problem. The current episode started 1 to 4 weeks ago (10-14 days). The problem has been gradually improving since onset. The pain is present in the lumbar spine. The quality of the pain is described as aching. The pain is at a severity of 5/10 (weekend 9-10). The pain is moderate. Associated symptoms include headaches. Pertinent negatives include no abdominal pain, chest pain, dysuria, fever, numbness, pelvic pain or weakness. She has tried nothing for the symptoms.     Review of Systems  Constitutional: Negative.  Negative for chills, fatigue, fever, malaise/fatigue and unexpected weight change.  HENT: Negative for congestion, ear discharge, ear pain, rhinorrhea, sinus pressure, sneezing and sore throat.   Eyes: Negative for blurred vision, photophobia, pain, discharge, redness and itching.  Respiratory: Negative for cough, shortness of breath, wheezing and stridor.   Cardiovascular: Negative for chest pain, palpitations, orthopnea and PND.    Gastrointestinal: Negative for abdominal pain, blood in stool, constipation, diarrhea, nausea and vomiting.  Endocrine: Negative for cold intolerance, heat intolerance, polydipsia, polyphagia and polyuria.  Genitourinary: Negative for dysuria, flank pain, frequency, hematuria, menstrual problem, pelvic pain, urgency, vaginal bleeding and vaginal discharge.  Musculoskeletal: Negative for arthralgias, back pain, myalgias and neck pain.  Skin: Negative for rash.  Allergic/Immunologic: Negative for environmental allergies and food allergies.  Neurological: Positive for headaches. Negative for dizziness, weakness, light-headedness and numbness.  Hematological: Negative for adenopathy. Does not bruise/bleed easily.  Psychiatric/Behavioral: Negative for dysphoric mood. The patient is not nervous/anxious.     Patient Active Problem List   Diagnosis Date Noted  . History of cervical dysplasia 11/11/2016  . Hypertension, postpartum condition or complication 53/61/4431  . Status post cesarean delivery 05/27/2016  . History of classical cesarean section 05/25/2016  . Elderly multigravida in third trimester 03/11/2016  . History of cesarean section, classical 03/11/2016  . Anxiety disorder due to known physiological condition 04/18/2014  . Bacterial upper respiratory infection 04/18/2014    Allergies  Allergen Reactions  . Sulfa Antibiotics Hives    As a child    Past Surgical History:  Procedure Laterality Date  . BUNIONECTOMY    . CESAREAN SECTION  2013  . CESAREAN SECTION WITH BILATERAL TUBAL LIGATION Bilateral 05/25/2016   Procedure: REPEAT CESAREAN SECTION WITH BILATERAL TUBAL LIGATION;  Surgeon: Gae Dry, MD;  Location: [facility] ORS;  Service: Obstetrics;  Laterality: Bilateral;  Female @ 1550 Apgars: 8/9 Weight: 7lb  2oz  . LAPAROSCOPY  2007    Social History   Tobacco Use  . Smoking status: Never Smoker  . Smokeless tobacco: Never Used  Substance Use Topics  . Alcohol  use: No    Alcohol/week: 0.0 standard drinks  . Drug use: No     Medication list has been reviewed and updated.  Current Meds  Medication Sig  . sertraline (ZOLOFT) 50 MG tablet Take 0.5 tablets (25 mg total) by mouth daily.    PHQ 2/9 Scores 10/04/2017 06/16/2017 06/16/2017 02/13/2015  PHQ - 2 Score 0 0 0 0  PHQ- 9 Score 0 0 - -    Physical Exam  Constitutional: She is oriented to person, place, and time. She appears well-developed and well-nourished.  HENT:  Head: Normocephalic.  Right Ear: External ear normal.  Left Ear: External ear normal.  Mouth/Throat: Oropharynx is clear and moist.  Eyes: Pupils are equal, round, and reactive to light. Conjunctivae and EOM are normal. Lids are everted and swept, no foreign bodies found. Left eye exhibits no hordeolum. No foreign body present in the left eye. Right conjunctiva is not injected. Left conjunctiva is not injected. No scleral icterus.  Neck: Normal range of motion. Neck supple. No JVD present. No tracheal deviation present. No thyromegaly present.  Cardiovascular: Normal rate, regular rhythm, normal heart sounds and intact distal pulses. Exam reveals no gallop and no friction rub.  No murmur heard. Pulmonary/Chest: Effort normal and breath sounds normal. No respiratory distress. She has no wheezes. She has no rales.  Abdominal: Soft. Bowel sounds are normal. She exhibits no mass. There is no hepatosplenomegaly. There is no tenderness. There is no rebound and no guarding.  Musculoskeletal: Normal range of motion. She exhibits no edema or tenderness.  Lymphadenopathy:    She has no cervical adenopathy.  Neurological: She is alert and oriented to person, place, and time. She has normal strength. She displays normal reflexes. No cranial nerve deficit.  Skin: Skin is warm. No rash noted.  Psychiatric: She has a normal mood and affect. Her mood appears not anxious. She does not exhibit a depressed mood.  Nursing note and vitals  reviewed.   BP (!) 140/98   Pulse 80   Ht 5\' 4"  (1.626 m)   Wt 135 lb (61.2 kg)   LMP 09/08/2017   BMI 23.17 kg/m   Assessment and Plan:  1. Elevated blood pressure reading in office without diagnosis of hypertension Start HCTZ 12.5mg  qday - cephALEXin (KEFLEX) 500 MG capsule; Take 1 capsule (500 mg total) by mouth 2 (two) times daily.  Dispense: 6 capsule; Refill: 0  2. Acute right-sided low back pain, unspecified whether sciatica present Urine sample positive for blood and WBC- start Keflex - cephALEXin (KEFLEX) 500 MG capsule; Take 1 capsule (500 mg total) by mouth 2 (two) times daily.  Dispense: 6 capsule; Refill: 0 - POCT urinalysis dipstick  3. Infective urethritis Start Keflex - hydrochlorothiazide (HYDRODIURIL) 12.5 MG tablet; Take 1 tablet (12.5 mg total) by mouth daily.  Dispense: 90 tablet; Refill: 1   Dr. Mac[facility]  10/04/2017

## 2017-10-04 NOTE — Patient Instructions (Signed)
The DASH Diet  Dietary Approaches to Stop Hypertension   What is hypertension?  Hypertension is the term for blood pressure that is consistently higher than normal. Blood pressure is the force of blood against artery walls. Blood pressure can be unhealthy if it is above 120/80. The higher your blood pressure, the greater the health risk. High blood pressure can be controlled if you take these steps:  . Maintain a healthy weight.  . Be physically active.  . Follow a healthy eating plan, which includes foods lower in salt and sodium.  . If you drink alcoholic beverages, do so in moderation.  As noted in this list, diet affects high blood pressure. Following the DASH diet and reducing the amount of sodium in your diet will help lower your blood pressure. It will also help prevent high blood pressure.   What is the DASH diet?  Dietary Approaches to Stop Hypertension (DASH) is a diet that is low in saturated fat, cholesterol, and total fat. It emphasizes fruits, vegetables, and low-fat dairy foods. The DASH diet also includes whole-grain products, fish, poultry, and nuts. It encourages fewer servings of red meat, sweets, and sugar-containing beverages. It is rich in magnesium, potassium, and calcium, as well as protein and fiber.   How do I get started on the DASH diet?  The DASH diet requires no special foods and has no hard-to-follow recipes. Start by seeing how DASH compares with your current eating habits. The DASH eating plan shown is based on 2,000 calories a day.   Your health care provider or a dietitian can help you determine how many calories a day you need. Most adults need somewhere between 1600 and 2800 calories a day. Serving sizes will vary between 1/2 cup and 1 1/4 cups. Check the product's nutrition label to determine serving sizes of particular products.  Type of Food Servings for Diet of 2000 Calories/Day  Grains/Grain products (include at least 3 whole grain foods each day) 7-8   Fruits 4-5  Vegetables 4-5  Low fat or non-fat dairy foods 2-3  Lean meats, fish, poultry 2 or less  Nuts, seeds, and legumes 4-5/week  Fats and sweets Limited   Make changes gradually.  Here are some suggestions that might help:  . If you now eat 1 or 2 servings of vegetables a day, add a serving at lunch and another at dinner.  . If you don't eat fruit now or have only juice at breakfast, add a serving to your meals or have it as a snack.  . Drink milk or water with lunch or dinner instead of soda, sugar sweetened tea, or alcohol. Choose low-fat (1%) or fat-free (skim) dairy products to reduce how much saturated fat, total fat, cholesterol, and calories you eat.  . If you have trouble digesting dairy products, try taking lactase enzyme pills or drops (available at drugstores and groceries) with the dairy foods. Or buy lactose-free milk or milk with lactase enzyme added to it.  . Read food labels on margarines and salad dressings to choose products lowest in fat.  . If you now eat large portions of meat, cut back gradually--by a half or a third at each meal. Limit meat to 6 ounces a day (2 servings). Three to four ounces is about the size of a deck of cards.  . Have 2 or more vegetarian-style (meatless) meals each week.   Increase servings of vegetables, rice, pasta, and beans in all meals.  Try casseroles and pasta, and   stir-fry dishes, which have less meat and more vegetables, grains, and beans.  . Use fruits canned in their own juice. Fresh fruits require little or no preparation. Dried fruits are a good choice to carry with you or to have ready in the car.  . Try these snacks ideas: unsalted pretzels or nuts mixed with raisins, graham crackers, low-fat and fat-free yogurt and frozen yogurt, popcorn with no salt or butter added, and raw vegetables.  . Choose whole grain foods to get more nutrients, including minerals and fiber. For example, choose whole-wheat bread or whole-grain cereals.   . Use fresh, frozen, or no-salt-added canned vegetables.   Remember to also reduce the salt and sodium in your diet. Try to have no more than 2000 milligrams (mg) of sodium per day, with a goal of further reducing the sodium to 1500 mg per day. Three important ways to reduce sodium are:  . Use reduced-sodium or no-salt-added food products.  . Use less salt when you prepare foods and do not add salt to your food at the table.  . Read fool labels. Aim for foods that are less than 5 percent of the daily value of sodium.   The DASH eating plan was not designed for weight loss. But it contains many lower calorie foods, such as fruits and vegetables. You can make it lower in calories by replacing higher calorie foods with more fruits and vegetables. Some ideas to increase fruits and vegetables and decrease calories include:  . Eat a medium apple instead of four shortbread cookies. You'll save 80 calories.  . Eat 1/4 cup of dried apricots instead of a 2-ounce bag of pork rinds. You'll save 230 calories.  . Have a hamburger that's 3 ounces instead of 6 ounces. Add a  cup serving of carrots and a 1/2 cup serving of spinach. You'll save more than 200 calories.  . Instead of 5 ounces of chicken, have a stir fry with 2 ounces ofchicken and 1 and 1/2 cups of raw vegetables. Use a small amount of vegetable oil. You'll save 50 calories.  . Have a 1/2 cup serving of low-fat frozen yogurt instead of a 1 and 1/2 ounce milk chocolate bar. You'll save about 110 calories.  . Use low-fat or fat-free condiments, such as fat free salad dressings.  . Eat smaller portions--cut back gradually.  . Use food labels to compare fat content in packaged foods. Items marked low-fat or fat-free may be lower in fat without being lower in calories than their regular versions.  . Limit foods with lots of added sugar, such as pies, flavored yogurts, candy bars, ice cream, sherbet, regular soft drinks, and fruit drinks.  . Drink water  or club soda instead of cola or other soda drinks.   High Blood Pressure: Low-Sodium Diet   Many people find that cutting down on sodium lowers their blood pressure. A low-sodium diet limits the amount of sodium in your diet to no more than 2300 milligrams a day. One teaspoon of salt has about 2300 milligrams of sodium.   Our taste for salt is mainly a habit. When you gradually lower the amount of salt in your diet, your taste begins to change. After a while, food begins to taste better without salt than it did with it.   Dietary Recommendations   Table salt added to foods is a common source of sodium in the diet. By not adding salt to foods, you can reduce the amount of sodium in your  diet. But sodium is also found in canned and prepared foods, even if they don't taste salty. Learn which foods to avoid by reading labels to find out how much sodium is in the foods. You can reduce the amount of sodium in your diet by following these guidelines:   Read labels carefully. Look for any form of sodium or salt, such as sodium benzoate or sodium citrate. Choose foods that have less salt.  Add very little or no salt to food that you prepare.  Check the sodium content when you use baking powder, baking soda, and monosodium glutamate (MSG).  Do not add salt to food at the table.  Fast foods are very high in salt, as are many other restaurant foods. When you eat at a restaurant, try steamed fish and vegetables or fresh salads. Avoid soups.  Avoid eating the following foods:  ketchup, prepared mustard, pickles, and olives  soy sauce, steak or barbecue sauce, chili sauce, or Worcestershire sauce  bouillon cubes  commercially prepared or cured meats or fish (for example, bacon, luncheon meats, and canned sardines)  canned vegetables, soups, and other packaged convenience foods  salty cheeses and buttermilk  salted nuts and peanut butter  self-rising flour and biscuit mixes  salted crackers, chips,  popcorn, and pretzels  commercial salad dressings  instant cooked cereals.    Many of these foods are now available in unsalted or low-sodium versions. Read all labels carefully.  If your diet must be restricted to much lower amounts of sodium, talk to your health care provider and a registered dietitian for help in planning your meals. It is important to keep your meals nutritionally balanced and tasty. It can be hard to follow a restricted-salt diet if the food doesn't taste good, but there are many healthy ways to add taste without adding salt or fat.   Use of Salt Substitutes   Ask your health care provider about using salt substitutes. Most salt substitutes contain potassium for flavor. If you are taking certain medications, you may need to be careful about the amount of potassium in your diet.        Substitutions and Hints   Season foods with herbs and spices. Use onions, garlic, parsley, lemon and lime juice and rind, dill weed, basil, tarragon, marjoram, thyme, curry powder, turmeric, cumin, paprika, vinegar, or wine to enhance the flavor and aroma of foods. Mushrooms, celery, red pepper, yellow pepper, green pepper, and dried fruits also enhance specific dishes.  Eat fresh foods (instead of canned or packaged foods) as much as possible. Also, plain frozen fruits and vegetables usually do not have added salt.  Add a pinch of sugar or a squeeze of lemon juice to bring out the flavor in fresh vegetables.  If you must use canned products, use the low-sodium types (except for fruit). Rinse canned vegetables with tap water before cooking.  Substitute unsalted, polyunsaturated margarine for regular margarine or butter.  Eat low-sodium cheeses. Many are available now, some with herbs and spices that are very tasty, and many are also low-fat.  Drink low-sodium juices.  Make unsalted or lightly salted soup stocks and keep them in the freezer to use as substitutes for canned broth and  bouillon. Use these stocks to enhance vegetables.  Substitute wines and vinegars (especially the flavored vinegars) for salt to enhance flavors.  Eat tuna and salmon and rinse first with running water.  Use herbs such as bay leaf, curry, turmeric, cumin, cilantro, dill, marjoram, paprika, pepper,  tarragon, thyme, sage, onions, or garlic to season chicken, beef, or fish.  Cook rice in homemade broth with mushrooms and scallions or shallots.  Help Yourself Become Healthier   Check food labels for sodium and fat content.  Read nutrition information available at your [employer], from the [employer], and through nutrition programs and health fairs. Ask your health care provider for printed information on nutrition, diet, and health.  Contact a dietitian for information.  Look for some of the excellent low-sodium cookbooks available in most bookstores.  Take time to plan and enjoy your meals. You will be pleasantly surprised at how fast you learn new food preparations, how lowering your sodium intake lowers your blood pressure, and how good food can be.

## 2017-10-05 NOTE — Telephone Encounter (Signed)
Schedule GYN [place] and [place] f/u soon, OK to overbook.

## 2017-10-06 NOTE — Telephone Encounter (Signed)
Schedule above,as did not realize Denise Clarke out.  Let pt know

## 2017-10-11 ENCOUNTER — Other Ambulatory Visit: Payer: Self-pay | Admitting: Obstetrics & Gynecology

## 2017-10-11 ENCOUNTER — Telehealth: Payer: Self-pay | Admitting: Obstetrics & Gynecology

## 2017-10-11 DIAGNOSIS — R102 Pelvic and perineal pain: Secondary | ICD-10-CM

## 2017-10-11 NOTE — Telephone Encounter (Signed)
Patient is schedule for ultrasound and annual on 10/20/17. Please advise ultrasound order.

## 2017-10-11 NOTE — Telephone Encounter (Signed)
Done

## 2017-10-20 ENCOUNTER — Ambulatory Visit (INDEPENDENT_AMBULATORY_CARE_PROVIDER_SITE_OTHER): Payer: PRIVATE HEALTH INSURANCE | Admitting: Obstetrics & Gynecology

## 2017-10-20 ENCOUNTER — Encounter: Payer: Self-pay | Admitting: Obstetrics & Gynecology

## 2017-10-20 ENCOUNTER — Ambulatory Visit (INDEPENDENT_AMBULATORY_CARE_PROVIDER_SITE_OTHER): Payer: PRIVATE HEALTH INSURANCE

## 2017-10-20 VITALS — BP 108/80 | Ht 64.0 in | Wt 134.0 lb

## 2017-10-20 DIAGNOSIS — Z01419 Encounter for gynecological examination (general) (routine) without abnormal findings: Secondary | ICD-10-CM | POA: Diagnosis not present

## 2017-10-20 DIAGNOSIS — Z0000 Encounter for general adult medical examination without abnormal findings: Secondary | ICD-10-CM

## 2017-10-20 DIAGNOSIS — R102 Pelvic and perineal pain: Secondary | ICD-10-CM

## 2017-10-20 NOTE — Progress Notes (Signed)
HPI:      Denise Clarke is a 37 y.o. [id number] who LMP was Patient's last menstrual period was 10/03/2017., she presents today for her annual examination. The patient has no complaints today. The patient is sexually active. Her last pap: approximate date 2018 and was normal. The patient does perform self breast exams.  There is no notable family history of breast or ovarian cancer in her family.  The patient has regular exercise: yes.  The patient denies current symptoms of depression.    Pt has recent h/o pain w ovulation and had [place] today to assess for cyst or other etiology for her pain.  US reveals normal findings.  GYN History: Contraception: tubal ligation  PMHx: Past Medical History:  Diagnosis Date  . AMA (advanced maternal age) multigravida 60+   . Esophageal reflux   . Headache    migraine  . Hx of pre-eclampsia in prior pregnancy, currently pregnant   . Type O blood, Rh negative    Past Surgical History:  Procedure Laterality Date  . BUNIONECTOMY    . CESAREAN SECTION  2013  . CESAREAN SECTION WITH BILATERAL TUBAL LIGATION Bilateral 05/25/2016   Procedure: REPEAT CESAREAN SECTION WITH BILATERAL TUBAL LIGATION;  Surgeon: Gae Dry, MD;  Location: [facility] ORS;  Service: Obstetrics;  Laterality: Bilateral;  Female @ 1550 Apgars: 8/9 Weight: 7lb  2oz  . LAPAROSCOPY  2007   Family History  Problem Relation Age of Onset  . Diabetes Father   . Hypertension Father   . Cancer Brother        leukemia  . Breast cancer Maternal Grandmother 82   Social History   Tobacco Use  . Smoking status: Never Smoker  . Smokeless tobacco: Never Used  Substance Use Topics  . Alcohol use: No    Alcohol/week: 0.0 standard drinks  . Drug use: No    Current Outpatient Medications:  .  hydrochlorothiazide (HYDRODIURIL) 12.5 MG tablet, Take 1 tablet (12.5 mg total) by mouth daily., Disp: 90 tablet, Rfl: 1 .  sertraline (ZOLOFT) 50 MG tablet, Take 0.5 tablets (25 mg total) by  mouth daily., Disp: 30 tablet, Rfl: 6 .  cephALEXin (KEFLEX) 500 MG capsule, Take 1 capsule (500 mg total) by mouth 2 (two) times daily. (Patient not taking: Reported on 10/20/2017), Disp: 6 capsule, Rfl: 0 Allergies: Sulfa antibiotics  Review of Systems  Constitutional: Negative for chills, fever and malaise/fatigue.  HENT: Negative for congestion, sinus pain and sore throat.   Eyes: Negative for blurred vision and pain.  Respiratory: Negative for cough and wheezing.   Cardiovascular: Negative for chest pain and leg swelling.  Gastrointestinal: Negative for abdominal pain, constipation, diarrhea, heartburn, nausea and vomiting.  Genitourinary: Negative for dysuria, frequency, hematuria and urgency.  Musculoskeletal: Negative for back pain, joint pain, myalgias and neck pain.  Skin: Negative for itching and rash.  Neurological: Negative for dizziness, tremors and weakness.  Endo/Heme/Allergies: Does not bruise/bleed easily.  Psychiatric/Behavioral: Negative for depression. The patient is not nervous/anxious and does not have insomnia.   All other systems reviewed and are negative.   Objective: BP 108/80   Ht 5\' 4"  (1.626 m)   Wt 134 lb (60.8 kg)   LMP 10/03/2017   BMI 23.00 kg/m   Filed Weights   10/20/17 1429  Weight: 134 lb (60.8 kg)   Body mass index is 23 kg/m. Physical Exam  Constitutional: She is oriented to person, place, and time. She appears well-developed and well-nourished. No  distress.  Genitourinary: Rectum normal, vagina normal and uterus normal. Pelvic exam was performed with patient supine. There is no rash or lesion on the right labia. There is no rash or lesion on the left labia. Vagina exhibits no lesion. No bleeding in the vagina. Right adnexum does not display mass and does not display tenderness. Left adnexum does not display mass and does not display tenderness. Cervix does not exhibit motion tenderness, lesion, friability or polyp.   Uterus is mobile and  midaxial. Uterus is not enlarged or exhibiting a mass.  HENT:  Head: Normocephalic and atraumatic. Head is without laceration.  Right Ear: Hearing normal.  Left Ear: Hearing normal.  Nose: No epistaxis.  No foreign bodies.  Mouth/Throat: Uvula is midline, oropharynx is clear and moist and mucous membranes are normal.  Eyes: Pupils are equal, round, and reactive to light.  Neck: Normal range of motion. Neck supple. No thyromegaly present.  Cardiovascular: Normal rate and regular rhythm. Exam reveals no gallop and no friction rub.  No murmur heard. Pulmonary/Chest: Effort normal and breath sounds normal. No respiratory distress. She has no wheezes. Right breast exhibits no mass, no skin change and no tenderness. Left breast exhibits no mass, no skin change and no tenderness.  Abdominal: Soft. Bowel sounds are normal. She exhibits no distension. There is no tenderness. There is no rebound.  Musculoskeletal: Normal range of motion.  Neurological: She is alert and oriented to person, place, and time. No cranial nerve deficit.  Skin: Skin is warm and dry.  Psychiatric: She has a normal mood and affect. Judgment normal.  Vitals reviewed.  Assessment:  ANNUAL EXAM 1. Annual physical exam    Screening Plan:            1.  Cervical Screening-  Pap smear schedule reviewed with patient  2. Breast screening- Exam annually and mammogram>40 planned   3. Colonoscopy every 10 years, Hemoccult testing - after age 70  4. Labs managed by PCP  5. Counseling for contraception: bilateral tubal ligation   6. No sign of ovarian cysts.  Hormone tx options discussed, declined, to suppress ovulation and [place].    F/U  Return in about 1 year (around 10/21/2018) for Annual.  Barnett Applebaum, MD, [facility], [facility] 10/20/2017  2:57 PM

## 2017-10-20 NOTE — Patient Instructions (Signed)
PAP every three years Labs yearly (with PCP)

## 2017-11-02 ENCOUNTER — Ambulatory Visit: Payer: PRIVATE HEALTH INSURANCE | Admitting: Obstetrics & Gynecology

## 2017-11-24 ENCOUNTER — Ambulatory Visit (INDEPENDENT_AMBULATORY_CARE_PROVIDER_SITE_OTHER): Payer: PRIVATE HEALTH INSURANCE | Admitting: Family Medicine

## 2017-11-24 ENCOUNTER — Encounter: Payer: Self-pay | Admitting: Family Medicine

## 2017-11-24 VITALS — BP 130/70 | HR 84 | Ht 64.0 in | Wt 136.0 lb

## 2017-11-24 DIAGNOSIS — N342 Other urethritis: Secondary | ICD-10-CM | POA: Diagnosis not present

## 2017-11-24 DIAGNOSIS — I10 Essential (primary) hypertension: Secondary | ICD-10-CM | POA: Diagnosis not present

## 2017-11-24 DIAGNOSIS — R1011 Right upper quadrant pain: Secondary | ICD-10-CM

## 2017-11-24 LAB — POCT URINALYSIS DIPSTICK
BILIRUBIN UA: NEGATIVE
Glucose, UA: NEGATIVE
Ketones, UA: NEGATIVE
Leukocytes, UA: NEGATIVE
NITRITE UA: NEGATIVE
Protein, UA: NEGATIVE
RBC UA: NEGATIVE
Spec Grav, UA: 1.01 (ref 1.010–1.025)
UROBILINOGEN UA: 0.2 U/dL
pH, UA: 6 (ref 5.0–8.0)

## 2017-11-24 MED ORDER — PANTOPRAZOLE SODIUM 40 MG PO TBEC: 40.0000 mg | DELAYED_RELEASE_TABLET | Freq: Every day | ORAL | 1 refills | Status: DC

## 2017-11-24 MED ORDER — HYDROCHLOROTHIAZIDE 12.5 MG PO TABS: 12.5000 mg | ORAL_TABLET | Freq: Every day | ORAL | 1 refills | Status: DC

## 2017-11-24 NOTE — Progress Notes (Signed)
Date:  11/24/2017   Name:  Denise Clarke   DOB:  1980/08/09   MRN:  [id number]   Chief Complaint: Hypertension (refill HCTZ, needs labs) Hypertension  This is a chronic problem. The current episode started more than 1 year ago. The problem is unchanged. The problem is controlled. Pertinent negatives include no anxiety, blurred vision, chest pain, headaches, malaise/fatigue, neck pain, orthopnea, palpitations, peripheral edema, PND, shortness of breath or sweats. There are no associated agents to hypertension. Past treatments include diuretics. The current treatment provides moderate improvement. There are no compliance problems.  There is no history of angina, kidney disease, CAD/MI, CVA, heart failure, left ventricular hypertrophy, PVD or retinopathy. There is no history of chronic renal disease, a hypertension causing med or renovascular disease.  Abdominal Pain  This is a new problem. The current episode started 1 to 4 weeks ago. The onset quality is gradual. The problem occurs daily. The problem has been waxing and waning. The pain is located in the RUQ and epigastric region. The pain is at a severity of 3/10 (duration minutes). The pain is moderate. The quality of the pain is colicky. The abdominal pain radiates to the LUQ. Pertinent negatives include no arthralgias, constipation, diarrhea, dysuria, fever, frequency, headaches, hematochezia, hematuria, melena, myalgias, nausea, vomiting or weight loss. Exacerbated by: certain foods. The pain is relieved by nothing.     Review of Systems  Constitutional: Negative.  Negative for chills, fatigue, fever, malaise/fatigue, unexpected weight change and weight loss.  HENT: Negative for congestion, ear discharge, ear pain, rhinorrhea, sinus pressure, sneezing and sore throat.   Eyes: Negative for blurred vision, photophobia, pain, discharge, redness and itching.  Respiratory: Negative for cough, shortness of breath, wheezing and stridor.     Cardiovascular: Negative for chest pain, palpitations, orthopnea and PND.  Gastrointestinal: Positive for abdominal pain. Negative for blood in stool, constipation, diarrhea, hematochezia, melena, nausea and vomiting.  Endocrine: Negative for cold intolerance, heat intolerance, polydipsia, polyphagia and polyuria.  Genitourinary: Negative for dysuria, flank pain, frequency, hematuria, menstrual problem, pelvic pain, urgency, vaginal bleeding and vaginal discharge.  Musculoskeletal: Negative for arthralgias, back pain, myalgias and neck pain.  Skin: Negative for rash.  Allergic/Immunologic: Negative for environmental allergies and food allergies.  Neurological: Negative for dizziness, weakness, light-headedness, numbness and headaches.  Hematological: Negative for adenopathy. Does not bruise/bleed easily.  Psychiatric/Behavioral: Negative for dysphoric mood. The patient is not nervous/anxious.     Patient Active Problem List   Diagnosis Date Noted  . History of cervical dysplasia 11/11/2016  . Hypertension, postpartum condition or complication 78/29/5621  . Status post cesarean delivery 05/27/2016  . History of classical cesarean section 05/25/2016  . Elderly multigravida in third trimester 03/11/2016  . History of cesarean section, classical 03/11/2016  . Anxiety disorder due to known physiological condition 04/18/2014  . Bacterial upper respiratory infection 04/18/2014    Allergies  Allergen Reactions  . Sulfa Antibiotics Hives    As a child    Past Surgical History:  Procedure Laterality Date  . BUNIONECTOMY    . CESAREAN SECTION  2013  . CESAREAN SECTION WITH BILATERAL TUBAL LIGATION Bilateral 05/25/2016   Procedure: REPEAT CESAREAN SECTION WITH BILATERAL TUBAL LIGATION;  Surgeon: Gae Dry, MD;  Location: [facility] ORS;  Service: Obstetrics;  Laterality: Bilateral;  Female @ 1550 Apgars: 8/9 Weight: 7lb  2oz  . LAPAROSCOPY  2007    Social History   Tobacco Use  .  Smoking status: Never Smoker  .  Smokeless tobacco: Never Used  Substance Use Topics  . Alcohol use: No    Alcohol/week: 0.0 standard drinks  . Drug use: No     Medication list has been reviewed and updated.  Current Meds  Medication Sig  . hydrochlorothiazide (HYDRODIURIL) 12.5 MG tablet Take 1 tablet (12.5 mg total) by mouth daily.  . sertraline (ZOLOFT) 50 MG tablet Take 0.5 tablets (25 mg total) by mouth daily.  . [DISCONTINUED] hydrochlorothiazide (HYDRODIURIL) 12.5 MG tablet Take 1 tablet (12.5 mg total) by mouth daily.    PHQ 2/9 Scores 11/24/2017 10/04/2017 06/16/2017 06/16/2017  PHQ - 2 Score 0 0 0 0  PHQ- 9 Score 0 0 0 -    Physical Exam  Constitutional: She is oriented to person, place, and time. She appears well-developed and well-nourished.  HENT:  Head: Normocephalic.  Right Ear: External ear normal.  Left Ear: External ear normal.  Mouth/Throat: Oropharynx is clear and moist.  Eyes: Pupils are equal, round, and reactive to light. Conjunctivae and EOM are normal. Lids are everted and swept, no foreign bodies found. Left eye exhibits no hordeolum. No foreign body present in the left eye. Right conjunctiva is not injected. Left conjunctiva is not injected. No scleral icterus.  Neck: Normal range of motion. Neck supple. No JVD present. No tracheal deviation present. No thyromegaly present.  Cardiovascular: Normal rate, regular rhythm, normal heart sounds and intact distal pulses. Exam reveals no gallop and no friction rub.  No murmur heard. Pulmonary/Chest: Effort normal and breath sounds normal. No respiratory distress. She has no wheezes. She has no rales.  Abdominal: Soft. Bowel sounds are normal. She exhibits no mass. There is no hepatosplenomegaly. There is no tenderness. There is no rebound and no guarding.  Musculoskeletal: Normal range of motion. She exhibits no edema or tenderness.  Lymphadenopathy:    She has no cervical adenopathy.  Neurological: She is alert  and oriented to person, place, and time. She has normal strength. She displays normal reflexes. No cranial nerve deficit.  Skin: Skin is warm. No rash noted.  Psychiatric: She has a normal mood and affect. Her mood appears not anxious. She does not exhibit a depressed mood.  Nursing note and vitals reviewed.   BP 130/70   Pulse 84   Ht 5\' 4"  (1.626 m)   Wt 136 lb (61.7 kg)   LMP 11/20/2017 (Exact Date)   BMI 23.34 kg/m   Assessment and Plan: 1. Infective urethritis Stable on med- refill HCTZ/ obtain urine - hydrochlorothiazide (HYDRODIURIL) 12.5 MG tablet; Take 1 tablet (12.5 mg total) by mouth daily.  Dispense: 90 tablet; Refill: 1 - POCT urinalysis dipstick  2. Right upper quadrant abdominal pain Start pantoprazole  - pantoprazole (PROTONIX) 40 MG tablet; Take 1 tablet (40 mg total) by mouth daily.  Dispense: 30 tablet; Refill: 1  3. Elevated blood pressure reading in office with diagnosis of hypertension Draw CMP - Comprehensive metabolic panel   Dr. Otilio Miu [facility]  11/24/2017

## 2017-11-25 LAB — COMPREHENSIVE METABOLIC PANEL
ALK PHOS: 66 IU/L (ref 39–117)
ALT: 12 IU/L (ref 0–32)
AST: 15 IU/L (ref 0–40)
Albumin/Globulin Ratio: 2.1 (ref 1.2–2.2)
Albumin: 5 g/dL (ref 3.5–5.5)
BUN/Creatinine Ratio: 16 (ref 9–23)
BUN: 13 mg/dL (ref 6–20)
Bilirubin Total: 0.7 mg/dL (ref 0.0–1.2)
CO2: 22 mmol/L (ref 20–29)
CREATININE: 0.83 mg/dL (ref 0.57–1.00)
Calcium: 9.4 mg/dL (ref 8.7–10.2)
Chloride: 99 mmol/L (ref 96–106)
GFR calc Af Amer: 103 mL/min/{1.73_m2} (ref >59)
GFR calc non Af Amer: 90 mL/min/{1.73_m2} (ref >59)
GLOBULIN, TOTAL: 2.4 g/dL (ref 1.5–4.5)
GLUCOSE: 89 mg/dL (ref 65–99)
Potassium: 4 mmol/L (ref 3.5–5.2)
SODIUM: 142 mmol/L (ref 134–144)
Total Protein: 7.4 g/dL (ref 6.0–8.5)

## 2017-11-25 LAB — LIPID PANEL WITH LDL/HDL RATIO
CHOLESTEROL TOTAL: 241 mg/dL — AB (ref 100–199)
HDL: 62 mg/dL (ref >39)
LDL CALC: 161 mg/dL — AB (ref 0–99)
LDl/HDL Ratio: 2.6 ratio (ref 0.0–3.2)
TRIGLYCERIDES: 92 mg/dL (ref 0–149)
VLDL Cholesterol Cal: 18 mg/dL (ref 5–40)

## 2017-12-21 ENCOUNTER — Other Ambulatory Visit: Payer: Self-pay | Admitting: Obstetrics & Gynecology

## 2018-01-11 ENCOUNTER — Other Ambulatory Visit: Payer: Self-pay | Admitting: Obstetrics & Gynecology

## 2018-01-25 ENCOUNTER — Other Ambulatory Visit: Payer: Self-pay

## 2018-01-25 DIAGNOSIS — R1012 Left upper quadrant pain: Secondary | ICD-10-CM

## 2018-01-25 NOTE — Progress Notes (Unsigned)
Order [place] of abd

## 2018-01-29 ENCOUNTER — Ambulatory Visit
Admission: RE | Admit: 2018-01-29 | Discharge: 2018-01-29 | Disposition: A | Discharge status: Home / Self Care | Payer: PRIVATE HEALTH INSURANCE | Source: Ambulatory Visit | Attending: Family Medicine | Admitting: Family Medicine

## 2018-01-29 DIAGNOSIS — R1012 Left upper quadrant pain: Secondary | ICD-10-CM | POA: Insufficient documentation

## 2018-03-27 ENCOUNTER — Other Ambulatory Visit: Payer: Self-pay

## 2018-03-27 ENCOUNTER — Telehealth (INDEPENDENT_AMBULATORY_CARE_PROVIDER_SITE_OTHER): Payer: PRIVATE HEALTH INSURANCE | Admitting: Family Medicine

## 2018-03-27 DIAGNOSIS — J0100 Acute maxillary sinusitis, unspecified: Secondary | ICD-10-CM | POA: Diagnosis not present

## 2018-03-27 MED ORDER — AZITHROMYCIN 250 MG PO TABS: ORAL_TABLET | ORAL | 0 refills | Status: DC

## 2018-03-27 NOTE — Progress Notes (Signed)
Virtual Visit via Telephone Note  I connected with Denise Clarke on 03/27/18 at  2:40 PM EDT by telephone and verified that I am speaking with the correct person using two identifiers.   I discussed the limitations, risks, security and privacy concerns of performing an evaluation and management service by telephone and the availability of in person appointments. I also discussed with the patient that there may be a patient responsible charge related to this service. The patient expressed understanding and agreed to proceed.   History of Present Illness: Sinusitis: Patient presents with chronic sinusitis. The patient reports chronic sinus infections for 4 days.  Her symptoms include nasal congestion,purulent nasal dischare,no fever,no dyspnea..  There has not been a history of purulent rhinorrhea. There does not have been a history of chronic otitis media or pharyngotonsillitis.  Prior antibiotic therapy has included no recent courses. Other medications have included Zyrtec, Robitussin.  She has not had allergy testing which was not done.      Observations/Objective: Alert and oriented on phone. Minimal cough with slight hoarseness.   Assessment and Plan: 1. Acute maxillary sinusitis, recurrence not specified .  Acute.  Persistent.  Patient was encouraged to continue over-the-counter cough preparation.  A azithromycin 250 mg beginning with 2 today and continuing with 1 a day for 4 days thereafter. - azithromycin (ZITHROMAX) 250 MG tablet; 2 today then 1 a day for 4 days  Dispense: 6 tablet; Refill: 0  Follow Up Instructions: Follow-up if needed   I discussed the assessment and treatment plan with the patient. The patient was provided an opportunity to ask questions and all were answered. The patient agreed with the plan and demonstrated an understanding of the instructions.   The patient was advised to call back or seek an in-person evaluation if the symptoms worsen or if the condition  fails to improve as anticipated.  I provided 10 minutes of non-face-to-face time during this encounter.   Denise Miu, MD

## 2018-03-30 ENCOUNTER — Telehealth: Payer: Self-pay | Admitting: Family Medicine

## 2018-04-01 ENCOUNTER — Other Ambulatory Visit: Payer: Self-pay | Admitting: Family Medicine

## 2018-04-01 DIAGNOSIS — R1011 Right upper quadrant pain: Secondary | ICD-10-CM

## 2018-06-25 ENCOUNTER — Other Ambulatory Visit: Payer: Self-pay | Admitting: Family Medicine

## 2018-06-25 DIAGNOSIS — N342 Other urethritis: Secondary | ICD-10-CM

## 2018-07-26 ENCOUNTER — Other Ambulatory Visit: Payer: Self-pay

## 2018-07-26 DIAGNOSIS — N342 Other urethritis: Secondary | ICD-10-CM

## 2018-07-26 MED ORDER — HYDROCHLOROTHIAZIDE 12.5 MG PO TABS: ORAL_TABLET | ORAL | 0 refills | Status: DC

## 2018-08-03 ENCOUNTER — Ambulatory Visit: Payer: PRIVATE HEALTH INSURANCE | Admitting: Family Medicine

## 2018-08-03 ENCOUNTER — Encounter: Payer: Self-pay | Admitting: Family Medicine

## 2018-08-03 ENCOUNTER — Other Ambulatory Visit: Payer: Self-pay

## 2018-08-03 VITALS — BP 120/80 | HR 68 | Ht 64.0 in | Wt 134.0 lb

## 2018-08-03 DIAGNOSIS — I10 Essential (primary) hypertension: Secondary | ICD-10-CM | POA: Diagnosis not present

## 2018-08-03 DIAGNOSIS — E7801 Familial hypercholesterolemia: Secondary | ICD-10-CM

## 2018-08-03 DIAGNOSIS — F064 Anxiety disorder due to known physiological condition: Secondary | ICD-10-CM

## 2018-08-03 MED ORDER — HYDROCHLOROTHIAZIDE 12.5 MG PO TABS: ORAL_TABLET | ORAL | 1 refills | Status: DC

## 2018-08-03 MED ORDER — SERTRALINE HCL 50 MG PO TABS: 50.0000 mg | ORAL_TABLET | Freq: Every day | ORAL | 1 refills | Status: DC

## 2018-08-03 NOTE — Progress Notes (Signed)
Date:  08/03/2018   Name:  Denise Clarke   DOB:  October 14, 1980   MRN:  [id number]   Chief Complaint: Hypertension (PHQ9=0) and Depression  Hypertension This is a chronic problem. The current episode started more than 1 year ago. The problem is unchanged. The problem is controlled. Pertinent negatives include no anxiety, blurred vision, chest pain, headaches, malaise/fatigue, neck pain, orthopnea, palpitations, peripheral edema, PND, shortness of breath or sweats. There are no associated agents to hypertension. There are no known risk factors for coronary artery disease. Past treatments include diuretics. The current treatment provides moderate improvement. There are no compliance problems.  There is no history of angina, kidney disease, CAD/MI, CVA, heart failure, left ventricular hypertrophy, PVD or retinopathy. There is no history of chronic renal disease, a hypertension causing med or renovascular disease.  Depression        This is a chronic problem.  The current episode started more than 1 year ago.   The onset quality is sudden.   The problem occurs intermittently.  The problem has been gradually improving since onset.  Associated symptoms include no decreased concentration, no fatigue, no helplessness, no hopelessness, does not have insomnia, not irritable, no restlessness, no decreased interest, no appetite change, no body aches, no myalgias, no headaches, no indigestion, not sad and no suicidal ideas.     The symptoms are aggravated by nothing.  Past treatments include SSRIs - Selective serotonin reuptake inhibitors.  Compliance with treatment is variable.  Previous treatment provided mild relief.  Risk factors include a change in medication usage/dosage.    Pertinent negatives include no anxiety.   Review of Systems  Constitutional: Negative for appetite change, fatigue and malaise/fatigue.  Eyes: Negative for blurred vision.  Respiratory: Negative for shortness of breath.    Cardiovascular: Negative for chest pain, palpitations, orthopnea and PND.  Musculoskeletal: Negative for myalgias and neck pain.  Neurological: Negative for headaches.  Psychiatric/Behavioral: Positive for depression. Negative for decreased concentration and suicidal ideas. The patient does not have insomnia.     Patient Active Problem List   Diagnosis Date Noted  . History of cervical dysplasia 11/11/2016  . Hypertension, postpartum condition or complication 73/42/8768  . Status post cesarean delivery 05/27/2016  . History of classical cesarean section 05/25/2016  . Elderly multigravida in third trimester 03/11/2016  . History of cesarean section, classical 03/11/2016  . Anxiety disorder due to known physiological condition 04/18/2014  . Bacterial upper respiratory infection 04/18/2014    Allergies  Allergen Reactions  . Sulfa Antibiotics Hives    As a child    Past Surgical History:  Procedure Laterality Date  . BUNIONECTOMY    . CESAREAN SECTION  2013  . CESAREAN SECTION WITH BILATERAL TUBAL LIGATION Bilateral 05/25/2016   Procedure: REPEAT CESAREAN SECTION WITH BILATERAL TUBAL LIGATION;  Surgeon: Gae Dry, MD;  Location: [facility] ORS;  Service: Obstetrics;  Laterality: Bilateral;  Female @ 1550 Apgars: 8/9 Weight: 7lb  2oz  . LAPAROSCOPY  2007    Social History   Tobacco Use  . Smoking status: Never Smoker  . Smokeless tobacco: Never Used  Substance Use Topics  . Alcohol use: No    Alcohol/week: 0.0 standard drinks  . Drug use: No     Medication list has been reviewed and updated.  Current Meds  Medication Sig  . hydrochlorothiazide (HYDRODIURIL) 12.5 MG tablet TAKE 1 TABLET(12.5 MG) BY MOUTH DAILY  . sertraline (ZOLOFT) 50 MG tablet Take 1 tablet (50  mg total) by mouth daily. (Patient taking differently: Take 25 mg by mouth daily. )  . [DISCONTINUED] pantoprazole (PROTONIX) 40 MG tablet TAKE 1 TABLET(40 MG) BY MOUTH DAILY    PHQ 2/9 Scores 08/03/2018  11/24/2017 10/04/2017 06/16/2017  PHQ - 2 Score 0 0 0 0  PHQ- 9 Score 0 0 0 0    BP Readings from Last 3 Encounters:  08/03/18 120/80  11/24/17 130/70  10/20/17 108/80    Physical Exam Vitals signs and nursing note reviewed.  Constitutional:      General: She is not irritable.    Appearance: Normal appearance. She is well-developed and normal weight.  HENT:     Head: Normocephalic.     Right Ear: Tympanic membrane, ear canal and external ear normal.     Left Ear: Tympanic membrane, ear canal and external ear normal.     Nose: Nose normal. No congestion or rhinorrhea.     Mouth/Throat:     Mouth: Mucous membranes are moist.     Pharynx: Oropharynx is clear.  Eyes:     General: Lids are everted, no foreign bodies appreciated. No scleral icterus.       Left eye: No foreign body or hordeolum.     Conjunctiva/sclera: Conjunctivae normal.     Right eye: Right conjunctiva is not injected.     Left eye: Left conjunctiva is not injected.     Pupils: Pupils are equal, round, and reactive to light.  Neck:     Musculoskeletal: Normal range of motion and neck supple.     Thyroid: No thyromegaly.     Vascular: No JVD.     Trachea: No tracheal deviation.  Cardiovascular:     Rate and Rhythm: Normal rate and regular rhythm.     Pulses: Normal pulses.          Carotid pulses are 2+ on the right side and 2+ on the left side.      Radial pulses are 2+ on the right side and 2+ on the left side.       Femoral pulses are 2+ on the right side and 2+ on the left side.      Popliteal pulses are 2+ on the right side and 2+ on the left side.       Dorsalis pedis pulses are 2+ on the right side and 2+ on the left side.       Posterior tibial pulses are 2+ on the right side and 2+ on the left side.     Heart sounds: Normal heart sounds, S1 normal and S2 normal. No murmur. No systolic murmur. No diastolic murmur. No friction rub. No gallop. No S3 or S4 sounds.   Pulmonary:     Effort: Pulmonary effort  is normal. No respiratory distress.     Breath sounds: Normal breath sounds. No wheezing or rales.  Abdominal:     General: Bowel sounds are normal.     Palpations: Abdomen is soft. There is no mass.     Tenderness: There is no abdominal tenderness. There is no guarding or rebound.  Musculoskeletal: Normal range of motion.        General: No tenderness.     Right lower leg: No edema.     Left lower leg: No edema.  Lymphadenopathy:     Cervical: No cervical adenopathy.  Skin:    General: Skin is warm.     Findings: No rash.  Neurological:     Mental Status:  She is alert and oriented to person, place, and time.     Cranial Nerves: No cranial nerve deficit.     Deep Tendon Reflexes: Reflexes normal.  Psychiatric:        Mood and Affect: Mood is not anxious or depressed.     Wt Readings from Last 3 Encounters:  08/03/18 134 lb (60.8 kg)  11/24/17 136 lb (61.7 kg)  10/20/17 134 lb (60.8 kg)    BP 120/80   Pulse 68   Ht 5\' 4"  (1.626 m)   Wt 134 lb (60.8 kg)   BMI 23.00 kg/m   Assessment and Plan:  1. Essential hypertension Chronic.  Controlled.  Continue hydrochlorothiazide 12.5 mg once a day.  Will check renal function panel. - hydrochlorothiazide (HYDRODIURIL) 12.5 MG tablet; TAKE 1 TABLET(12.5 MG) BY MOUTH DAILY  Dispense: 90 tablet; Refill: 1 - Renal Function Panel  2. Anxiety disorder due to known physiological condition Gad score 0.  This is chronic.  Persistent.  We will continue sertraline at 25 mg which is one half of a 50 mg once a day. - sertraline (ZOLOFT) 50 MG tablet; Take 1 tablet (50 mg total) by mouth daily.  Dispense: 90 tablet; Refill: 1  3. Familial hypercholesterolemia Patient with previous history of elevated LDL.  Will check lipid panel and fasting state today. - Lipid Panel With LDL/HDL Ratio

## 2018-08-04 LAB — RENAL FUNCTION PANEL
Albumin: 4.8 g/dL (ref 3.8–4.8)
BUN/Creatinine Ratio: 13 (ref 9–23)
BUN: 12 mg/dL (ref 6–20)
CO2: 26 mmol/L (ref 20–29)
Calcium: 9.4 mg/dL (ref 8.7–10.2)
Chloride: 97 mmol/L (ref 96–106)
Creatinine, Ser: 0.9 mg/dL (ref 0.57–1.00)
GFR calc Af Amer: 93 mL/min/{1.73_m2} (ref >59)
GFR calc non Af Amer: 81 mL/min/{1.73_m2} (ref >59)
Glucose: 81 mg/dL (ref 65–99)
Phosphorus: 3.3 mg/dL (ref 3.0–4.3)
Potassium: 4.5 mmol/L (ref 3.5–5.2)
Sodium: 141 mmol/L (ref 134–144)

## 2018-08-04 LAB — LIPID PANEL WITH LDL/HDL RATIO
Cholesterol, Total: 226 mg/dL — ABNORMAL HIGH (ref 100–199)
HDL: 67 mg/dL (ref >39)
LDL Calculated: 140 mg/dL — ABNORMAL HIGH (ref 0–99)
LDl/HDL Ratio: 2.1 ratio (ref 0.0–3.2)
Triglycerides: 96 mg/dL (ref 0–149)
VLDL Cholesterol Cal: 19 mg/dL (ref 5–40)

## 2018-11-16 ENCOUNTER — Encounter: Payer: Self-pay | Admitting: Obstetrics & Gynecology

## 2018-11-16 ENCOUNTER — Ambulatory Visit (INDEPENDENT_AMBULATORY_CARE_PROVIDER_SITE_OTHER): Payer: PRIVATE HEALTH INSURANCE | Admitting: Obstetrics & Gynecology

## 2018-11-16 ENCOUNTER — Other Ambulatory Visit: Payer: Self-pay

## 2018-11-16 VITALS — BP 112/80 | Ht 64.0 in | Wt 138.0 lb

## 2018-11-16 DIAGNOSIS — Z01419 Encounter for gynecological examination (general) (routine) without abnormal findings: Secondary | ICD-10-CM

## 2018-11-16 NOTE — Progress Notes (Signed)
HPI:      [name] is a 38 y.o. [id number] who LMP was Patient's last menstrual period was 10/28/2018., she presents today for her annual examination. The patient has no complaints today. Periods reg every 25 days and last 4 days and are heavy. The patient is sexually active. Her last pap: approximate date 2018 and was normal. The patient does perform self breast exams.  There is notable family history of breast or ovarian cancer in her family.  The patient has regular exercise: yes.  The patient denies current symptoms of depression.    GYN History: Contraception: tubal ligation  PMHx: Past Medical History:  Diagnosis Date  . AMA (advanced maternal age) multigravida 57+   . Esophageal reflux   . Headache    migraine  . Hx of pre-eclampsia in prior pregnancy, currently pregnant   . Type O blood, Rh negative    Past Surgical History:  Procedure Laterality Date  . BUNIONECTOMY    . CESAREAN SECTION  2013  . CESAREAN SECTION WITH BILATERAL TUBAL LIGATION Bilateral 05/25/2016   Procedure: REPEAT CESAREAN SECTION WITH BILATERAL TUBAL LIGATION;  Surgeon: Gae Dry, MD;  Location: [facility] ORS;  Service: Obstetrics;  Laterality: Bilateral;  Female @ 1550 Apgars: 8/9 Weight: 7lb  2oz  . LAPAROSCOPY  2007   Family History  Problem Relation Age of Onset  . Diabetes Father   . Hypertension Father   . Cancer Brother        leukemia  . Breast cancer Maternal Grandmother 42   Social History   Tobacco Use  . Smoking status: Never Smoker  . Smokeless tobacco: Never Used  Substance Use Topics  . Alcohol use: No    Alcohol/week: 0.0 standard drinks  . Drug use: No    Current Outpatient Medications:  .  hydrochlorothiazide (HYDRODIURIL) 12.5 MG tablet, TAKE 1 TABLET(12.5 MG) BY MOUTH DAILY, Disp: 90 tablet, Rfl: 1 .  sertraline (ZOLOFT) 50 MG tablet, Take 1 tablet (50 mg total) by mouth daily., Disp: 90 tablet, Rfl: 1 Allergies: Sulfa antibiotics  Review of Systems   Constitutional: Negative for chills, fever and malaise/fatigue.  HENT: Negative for congestion, sinus pain and sore throat.   Eyes: Negative for blurred vision and pain.  Respiratory: Negative for cough and wheezing.   Cardiovascular: Negative for chest pain and leg swelling.  Gastrointestinal: Negative for abdominal pain, constipation, diarrhea, heartburn, nausea and vomiting.  Genitourinary: Negative for dysuria, frequency, hematuria and urgency.  Musculoskeletal: Negative for back pain, joint pain, myalgias and neck pain.  Skin: Negative for itching and rash.  Neurological: Negative for dizziness, tremors and weakness.  Endo/Heme/Allergies: Does not bruise/bleed easily.  Psychiatric/Behavioral: Negative for depression. The patient is not nervous/anxious and does not have insomnia.     Objective: BP 112/80   Ht 5\' 4"  (1.626 m)   Wt 138 lb (62.6 kg)   LMP 10/28/2018   BMI 23.69 kg/m   Filed Weights   11/16/18 0850  Weight: 138 lb (62.6 kg)   Body mass index is 23.69 kg/m. Physical Exam Constitutional:      General: She is not in acute distress.    Appearance: She is well-developed.  Genitourinary:     Pelvic exam was performed with patient supine.     Vagina, uterus and rectum normal.     No lesions in the vagina.     No vaginal bleeding.     No cervical motion tenderness, friability, lesion or polyp.  Uterus is mobile.     Uterus is not enlarged.     No uterine mass detected.    Uterus is midaxial.     No right or left adnexal mass present.     Right adnexa not tender.     Left adnexa not tender.  HENT:     Head: Normocephalic and atraumatic. No laceration.     Right Ear: Hearing normal.     Left Ear: Hearing normal.     Mouth/Throat:     Pharynx: Uvula midline.  Eyes:     Pupils: Pupils are equal, round, and reactive to light.  Neck:     Musculoskeletal: Normal range of motion and neck supple.     Thyroid: No thyromegaly.  Cardiovascular:     Rate and  Rhythm: Normal rate and regular rhythm.     Heart sounds: No murmur. No friction rub. No gallop.   Pulmonary:     Effort: Pulmonary effort is normal. No respiratory distress.     Breath sounds: Normal breath sounds. No wheezing.  Chest:     Breasts:        Right: No mass, skin change or tenderness.        Left: No mass, skin change or tenderness.  Abdominal:     General: Bowel sounds are normal. There is no distension.     Palpations: Abdomen is soft.     Tenderness: There is no abdominal tenderness. There is no rebound.  Musculoskeletal: Normal range of motion.  Neurological:     Mental Status: She is alert and oriented to person, place, and time.     Cranial Nerves: No cranial nerve deficit.  Skin:    General: Skin is warm and dry.  Psychiatric:        Judgment: Judgment normal.  Vitals signs reviewed.     Assessment:  ANNUAL EXAM 1. Women's annual routine gynecological examination      Screening Plan:            1.  Cervical Screening-  Pap smear schedule reviewed with patient  2. Breast screening- Exam annually and mammogram>40 planned - due next year for MMG  3.  Labs managed by PCP  4. Counseling for contraception: bilateral tubal ligation    F/U  Return in about 1 year (around 11/16/2019) for Annual.  Barnett Applebaum, MD, [facility], [facility] 11/16/2018  9:13 AM

## 2018-11-16 NOTE — Patient Instructions (Signed)
Health Maintenance, Female Adopting a healthy lifestyle and getting preventive care are important in promoting health and wellness. Ask your health care provider about:  The right schedule for you to have regular tests and exams.  Things you can do on your own to prevent diseases and keep yourself healthy. What should I know about diet, weight, and exercise? Eat a healthy diet   Eat a diet that includes plenty of vegetables, fruits, low-fat dairy products, and lean protein.  Do not eat a lot of foods that are high in solid fats, added sugars, or sodium. Maintain a healthy weight Body mass index (BMI) is used to identify weight problems. It estimates body fat based on height and weight. Your health care provider can help determine your BMI and help you achieve or maintain a healthy weight. Get regular exercise Get regular exercise. This is one of the most important things you can do for your health. Most adults should:  Exercise for at least 150 minutes each week. The exercise should increase your heart rate and make you sweat (moderate-intensity exercise).  Do strengthening exercises at least twice a week. This is in addition to the moderate-intensity exercise.  Spend less time sitting. Even light physical activity can be beneficial. Watch cholesterol and blood lipids Have your blood tested for lipids and cholesterol at 39 years of age, then have this test every 5 years. Have your cholesterol levels checked more often if:  Your lipid or cholesterol levels are high.  You are older than 40 years of age.  You are at high risk for heart disease. What should I know about cancer screening? Depending on your health history and family history, you may need to have cancer screening at various ages. This may include screening for:  Breast cancer.  Cervical cancer.  Colorectal cancer.  Skin cancer.  Lung cancer. What should I know about heart disease, diabetes, and high blood  pressure? Blood pressure and heart disease  High blood pressure causes heart disease and increases the risk of stroke. This is more likely to develop in people who have high blood pressure readings, are of African descent, or are overweight.  Have your blood pressure checked: ? Every 3-5 years if you are 18-39 years of age. ? Every year if you are 40 years old or older. Diabetes Have regular diabetes screenings. This checks your fasting blood sugar level. Have the screening done:  Once every three years after age 40 if you are at a normal weight and have a low risk for diabetes.  More often and at a younger age if you are overweight or have a high risk for diabetes. What should I know about preventing infection? Hepatitis B If you have a higher risk for hepatitis B, you should be screened for this virus. Talk with your health care provider to find out if you are at risk for hepatitis B infection. Hepatitis C Testing is recommended for:  Everyone born from 1945 through 1965.  Anyone with known risk factors for hepatitis C. Sexually transmitted infections (STIs)  Get screened for STIs, including gonorrhea and chlamydia, if: ? You are sexually active and are younger than 39 years of age. ? You are older than 39 years of age and your health care provider tells you that you are at risk for this type of infection. ? Your sexual activity has changed since you were last screened, and you are at increased risk for chlamydia or gonorrhea. Ask your health care provider if   you are at risk.  Ask your health care provider about whether you are at high risk for HIV. Your health care provider may recommend a prescription medicine to help prevent HIV infection. If you choose to take medicine to prevent HIV, you should first get tested for HIV. You should then be tested every 3 months for as long as you are taking the medicine. Pregnancy  If you are about to stop having your period (premenopausal) and  you may become pregnant, seek counseling before you get pregnant.  Take 400 to 800 micrograms (mcg) of folic acid every day if you become pregnant.  Ask for birth control (contraception) if you want to prevent pregnancy. Osteoporosis and menopause Osteoporosis is a disease in which the bones lose minerals and strength with aging. This can result in bone fractures. If you are 65 years old or older, or if you are at risk for osteoporosis and fractures, ask your health care provider if you should:  Be screened for bone loss.  Take a calcium or vitamin D supplement to lower your risk of fractures.  Be given hormone replacement therapy (HRT) to treat symptoms of menopause. Follow these instructions at home: Lifestyle  Do not use any products that contain nicotine or tobacco, such as cigarettes, e-cigarettes, and chewing tobacco. If you need help quitting, ask your health care provider.  Do not use street drugs.  Do not share needles.  Ask your health care provider for help if you need support or information about quitting drugs. Alcohol use  Do not drink alcohol if: ? Your health care provider tells you not to drink. ? You are pregnant, may be pregnant, or are planning to become pregnant.  If you drink alcohol: ? Limit how much you use to 0-1 drink a day. ? Limit intake if you are breastfeeding.  Be aware of how much alcohol is in your drink. In the [place] one drink equals one 12 oz bottle of beer (355 mL), one 5 oz glass of wine (148 mL), or one 1 oz glass of hard liquor (44 mL). General instructions  Schedule regular health, dental, and eye exams.  Stay current with your vaccines.  Tell your health care provider if: ? You often feel depressed. ? You have ever been abused or do not feel safe at home. Summary  Adopting a healthy lifestyle and getting preventive care are important in promoting health and wellness.  Follow your health care provider's instructions about healthy  diet, exercising, and getting tested or screened for diseases.  Follow your health care provider's instructions on monitoring your cholesterol and blood pressure. This information is not intended to replace advice given to you by your health care provider. Make sure you discuss any questions you have with your health care provider. Document Released: 07/05/2010 Document Revised: 12/13/2017 Document Reviewed: 12/13/2017 [employer]vier Patient Education  2020 [employer]  

## 2018-11-27 ENCOUNTER — Other Ambulatory Visit: Payer: Self-pay

## 2018-11-27 DIAGNOSIS — Z20822 Contact with and (suspected) exposure to covid-19: Secondary | ICD-10-CM

## 2018-11-29 LAB — NOVEL CORONAVIRUS, NAA: SARS-CoV-2, NAA: NOT DETECTED

## 2018-12-20 ENCOUNTER — Ambulatory Visit (INDEPENDENT_AMBULATORY_CARE_PROVIDER_SITE_OTHER): Payer: PRIVATE HEALTH INSURANCE | Admitting: Family Medicine

## 2018-12-20 ENCOUNTER — Encounter: Payer: Self-pay | Admitting: Family Medicine

## 2018-12-20 VITALS — BP 134/95 | Temp 97.2°F | Ht 64.0 in | Wt 136.0 lb

## 2018-12-20 DIAGNOSIS — R05 Cough: Secondary | ICD-10-CM

## 2018-12-20 DIAGNOSIS — J0100 Acute maxillary sinusitis, unspecified: Secondary | ICD-10-CM

## 2018-12-20 DIAGNOSIS — R059 Cough, unspecified: Secondary | ICD-10-CM

## 2018-12-20 MED ORDER — GUAIFENESIN-CODEINE 100-10 MG/5ML PO SYRP: 5.0000 mL | ORAL_SOLUTION | Freq: Four times a day (QID) | ORAL | 0 refills | Status: DC | PRN

## 2018-12-20 MED ORDER — AZITHROMYCIN 250 MG PO TABS: ORAL_TABLET | ORAL | 0 refills | Status: DC

## 2018-12-20 NOTE — Progress Notes (Signed)
Date:  12/20/2018   Name:  Denise Clarke   DOB:  1980-09-26   MRN:  [id number]   Chief Complaint: Cough (tested on 11/27/2018 neg for covid. cough with yellow/brown production)  I connected withthis patient, Denise Clarke, by telephoneat the patient's work.  I verified that I am speaking with the correct person using two identifiers. This visit was conducted via telephone due to the Covid-19 outbreak from my office at [facility] in [place], [place]. I discussed the limitations, risks, security and privacy concerns of performing an evaluation and management service by telephone. I also discussed with the patient that there may be a patient responsible charge related to this service. The patient expressed understanding and agreed to proceed.  Cough This is a chronic problem. The current episode started more than 1 year ago. The problem has been waxing and waning. The problem occurs hourly. The cough is productive of purulent sputum. Associated symptoms include nasal congestion and postnasal drip. Pertinent negatives include no chest pain, chills, ear congestion, ear pain, fever, headaches, heartburn, hemoptysis, myalgias, rash, rhinorrhea, sore throat, shortness of breath, sweats, weight loss or wheezing. Nothing aggravates the symptoms. Treatments tried: mucinex dm. The treatment provided no relief. There is no history of environmental allergies.  Sinusitis This is a chronic problem. The current episode started more than 1 year ago. The problem has been waxing and waning since onset. There has been no fever. The pain is moderate. Associated symptoms include coughing. Pertinent negatives include no chills, congestion, diaphoresis, ear pain, headaches, hoarse voice, neck pain, shortness of breath, sinus pressure, sneezing, sore throat or swollen glands.    Lab Results  Component Value Date   CREATININE 0.90 08/03/2018   BUN 12 08/03/2018   NA 141 08/03/2018   K 4.5 08/03/2018   CL 97 08/03/2018   CO2 26 08/03/2018   Lab Results  Component Value Date   CHOL 226 (H) 08/03/2018   HDL 67 08/03/2018   LDLCALC 140 (H) 08/03/2018   TRIG 96 08/03/2018   No results found for: TSH No results found for: HGBA1C   Review of Systems  Constitutional: Negative for appetite change, chills, diaphoresis, fatigue, fever, unexpected weight change and weight loss.  HENT: Positive for postnasal drip. Negative for congestion, drooling, ear discharge, ear pain, hoarse voice, rhinorrhea, sinus pressure, sneezing and sore throat.   Respiratory: Positive for cough. Negative for hemoptysis, shortness of breath and wheezing.   Cardiovascular: Negative for chest pain, palpitations and leg swelling.  Gastrointestinal: Negative for abdominal pain, blood in stool, constipation, diarrhea, heartburn and nausea.  Endocrine: Negative for polydipsia.  Genitourinary: Negative for dysuria, frequency, hematuria and urgency.  Musculoskeletal: Negative for back pain, myalgias and neck pain.  Skin: Negative for rash.  Allergic/Immunologic: Negative for environmental allergies.  Neurological: Negative for dizziness and headaches.  Hematological: Does not bruise/bleed easily.  Psychiatric/Behavioral: Negative for suicidal ideas. The patient is not nervous/anxious.     Patient Active Problem List   Diagnosis Date Noted  . History of cervical dysplasia 11/11/2016  . Hypertension, postpartum condition or complication 99991111  . History of classical cesarean section 05/25/2016  . History of cesarean section, classical 03/11/2016  . Anxiety disorder due to known physiological condition 04/18/2014  . Bacterial upper respiratory infection 04/18/2014    Allergies  Allergen Reactions  . Sulfa Antibiotics Hives    As a child    Past Surgical History:  Procedure Laterality Date  . BUNIONECTOMY    .  CESAREAN SECTION  2013  . CESAREAN SECTION WITH BILATERAL TUBAL LIGATION Bilateral 05/25/2016    Procedure: REPEAT CESAREAN SECTION WITH BILATERAL TUBAL LIGATION;  Surgeon: Gae Dry, MD;  Location: [facility] ORS;  Service: Obstetrics;  Laterality: Bilateral;  Female @ 1550 Apgars: 8/9 Weight: 7lb  2oz  . LAPAROSCOPY  2007    Social History   Tobacco Use  . Smoking status: Never Smoker  . Smokeless tobacco: Never Used  Substance Use Topics  . Alcohol use: No    Alcohol/week: 0.0 standard drinks  . Drug use: No     Medication list has been reviewed and updated.  Current Meds  Medication Sig  . hydrochlorothiazide (HYDRODIURIL) 12.5 MG tablet TAKE 1 TABLET(12.5 MG) BY MOUTH DAILY  . sertraline (ZOLOFT) 50 MG tablet Take 1 tablet (50 mg total) by mouth daily. (Patient taking differently: Take 25 mg by mouth daily. )    PHQ 2/9 Scores 12/20/2018 08/03/2018 11/24/2017 10/04/2017  PHQ - 2 Score 0 0 0 0  PHQ- 9 Score 0 0 0 0    BP Readings from Last 3 Encounters:  12/20/18 (!) 134/95  11/16/18 112/80  08/03/18 120/80    Physical Exam Vitals and nursing note reviewed.     Wt Readings from Last 3 Encounters:  12/20/18 136 lb (61.7 kg)  11/16/18 138 lb (62.6 kg)  08/03/18 134 lb (60.8 kg)    BP (!) 134/95   Temp (!) 97.2 F (36.2 C) (Oral)   Ht 5\' 4"  (1.626 m)   Wt 136 lb (61.7 kg)   LMP 12/13/2018 (Exact Date)   SpO2 98%   BMI 23.34 kg/m   Assessment and Plan: 1. Acute maxillary sinusitis, recurrence not specified Patient was seen as a televisit due to her cough and remote exposure to a positive Covid acquaintance.  Patient has had not had any difficulty breathing Raynaud's shortness of breath scratchy throat or unexpected loss of taste or smell.  Patient does have some tenderness over the maxillary sinuses and has been bringing up probably from a postnasal source rust colored sputum.  We will treat with a azithromycin to 50 mg 2 today followed by 1 a day for 4 days. - azithromycin (ZITHROMAX) 250 MG tablet; 2 today then 1 aday for 4 days  Dispense: 6 tablet;  Refill: 0  2. Cough Patient has a nonproductive cough which is partially controlled on Mucinex DM.  We will also add Robitussin-AC to be used in exchange for the Mucinex in the event the patient needs to sleep or not be coughing in the presence of other individuals. - guaiFENesin-codeine (ROBITUSSIN AC) 100-10 MG/5ML syrup; Take 5 mLs by mouth 4 (four) times daily as needed for cough.  Dispense: 118 mL; Refill: 0

## 2019-01-11 ENCOUNTER — Other Ambulatory Visit: Payer: Self-pay

## 2019-01-11 ENCOUNTER — Encounter: Payer: Self-pay | Admitting: Family Medicine

## 2019-01-11 ENCOUNTER — Ambulatory Visit (INDEPENDENT_AMBULATORY_CARE_PROVIDER_SITE_OTHER): Payer: PRIVATE HEALTH INSURANCE | Admitting: Family Medicine

## 2019-01-11 VITALS — BP 130/80 | HR 64 | Ht 64.0 in | Wt 142.0 lb

## 2019-01-11 DIAGNOSIS — E7801 Familial hypercholesterolemia: Secondary | ICD-10-CM | POA: Diagnosis not present

## 2019-01-11 DIAGNOSIS — I10 Essential (primary) hypertension: Secondary | ICD-10-CM | POA: Diagnosis not present

## 2019-01-11 DIAGNOSIS — F064 Anxiety disorder due to known physiological condition: Secondary | ICD-10-CM | POA: Diagnosis not present

## 2019-01-11 MED ORDER — SERTRALINE HCL 50 MG PO TABS: 50.0000 mg | ORAL_TABLET | Freq: Every day | ORAL | 1 refills | Status: DC

## 2019-01-11 MED ORDER — HYDROCHLOROTHIAZIDE 12.5 MG PO TABS: ORAL_TABLET | ORAL | 1 refills | Status: DC

## 2019-01-11 NOTE — Patient Instructions (Signed)

## 2019-01-11 NOTE — Progress Notes (Signed)
Date:  01/11/2019   Name:  Denise Clarke   DOB:  03-11-1980   MRN:  [id number]   Chief Complaint: Depression (PHQ9=0 and GAD7=0) and Hypertension  Depression        This is a chronic problem.  The current episode started more than 1 year ago.   The onset quality is gradual.   The problem has been gradually improving since onset.  Associated symptoms include no decreased concentration, no fatigue, no helplessness, no hopelessness, does not have insomnia, not irritable, no restlessness, no decreased interest, no appetite change, no body aches, no myalgias, no headaches, no indigestion, not sad and no suicidal ideas.     The symptoms are aggravated by medication.  Past treatments include SSRIs - Selective serotonin reuptake inhibitors.  Compliance with treatment is good.  Previous treatment provided moderate relief.   Pertinent negatives include no anxiety. Hypertension This is a chronic problem. The current episode started more than 1 year ago. The problem has been waxing and waning since onset. The problem is controlled. Pertinent negatives include no anxiety, blurred vision, chest pain, headaches, malaise/fatigue, neck pain, orthopnea, palpitations, peripheral edema, PND, shortness of breath or sweats. There are no associated agents to hypertension. The current treatment provides moderate improvement. There are no compliance problems.     Lab Results  Component Value Date   CREATININE 0.90 08/03/2018   BUN 12 08/03/2018   NA 141 08/03/2018   K 4.5 08/03/2018   CL 97 08/03/2018   CO2 26 08/03/2018   Lab Results  Component Value Date   CHOL 226 (H) 08/03/2018   HDL 67 08/03/2018   LDLCALC 140 (H) 08/03/2018   TRIG 96 08/03/2018   No results found for: TSH No results found for: HGBA1C   Review of Systems  Constitutional: Negative for appetite change, fatigue and malaise/fatigue.  Eyes: Negative for blurred vision.  Respiratory: Negative for shortness of breath.     Cardiovascular: Negative for chest pain, palpitations, orthopnea and PND.  Musculoskeletal: Negative for myalgias and neck pain.  Neurological: Negative for headaches.  Psychiatric/Behavioral: Positive for depression. Negative for decreased concentration and suicidal ideas. The patient does not have insomnia.     Patient Active Problem List   Diagnosis Date Noted  . History of cervical dysplasia 11/11/2016  . Hypertension, postpartum condition or complication 99991111  . History of classical cesarean section 05/25/2016  . History of cesarean section, classical 03/11/2016  . Anxiety disorder due to known physiological condition 04/18/2014  . Bacterial upper respiratory infection 04/18/2014    Allergies  Allergen Reactions  . Sulfa Antibiotics Hives    As a child    Past Surgical History:  Procedure Laterality Date  . BUNIONECTOMY    . CESAREAN SECTION  2013  . CESAREAN SECTION WITH BILATERAL TUBAL LIGATION Bilateral 05/25/2016   Procedure: REPEAT CESAREAN SECTION WITH BILATERAL TUBAL LIGATION;  Surgeon: Gae Dry, MD;  Location: [facility] ORS;  Service: Obstetrics;  Laterality: Bilateral;  Female @ 1550 Apgars: 8/9 Weight: 7lb  2oz  . LAPAROSCOPY  2007    Social History   Tobacco Use  . Smoking status: Never Smoker  . Smokeless tobacco: Never Used  Substance Use Topics  . Alcohol use: No    Alcohol/week: 0.0 standard drinks  . Drug use: No     Medication list has been reviewed and updated.  Current Meds  Medication Sig  . hydrochlorothiazide (HYDRODIURIL) 12.5 MG tablet TAKE 1 TABLET(12.5 MG) BY MOUTH DAILY  .  sertraline (ZOLOFT) 50 MG tablet Take 1 tablet (50 mg total) by mouth daily. (Patient taking differently: Take 25 mg by mouth daily. )    PHQ 2/9 Scores 01/11/2019 12/20/2018 08/03/2018 11/24/2017  PHQ - 2 Score 0 0 0 0  PHQ- 9 Score 0 0 0 0    BP Readings from Last 3 Encounters:  01/11/19 130/80  12/20/18 (!) 134/95  11/16/18 112/80    Physical  Exam Vitals and nursing note reviewed.  Constitutional:      General: She is not irritable.    Appearance: She is well-developed and normal weight.  HENT:     Head: Normocephalic.     Right Ear: Tympanic membrane, ear canal and external ear normal. There is no impacted cerumen.     Left Ear: Tympanic membrane, ear canal and external ear normal. There is no impacted cerumen.     Nose: Nose normal. No congestion or rhinorrhea.     Mouth/Throat:     Mouth: Mucous membranes are moist.  Eyes:     General: Lids are everted, no foreign bodies appreciated. No scleral icterus.       Left eye: No foreign body or hordeolum.     Conjunctiva/sclera: Conjunctivae normal.     Right eye: Right conjunctiva is not injected.     Left eye: Left conjunctiva is not injected.     Pupils: Pupils are equal, round, and reactive to light.  Neck:     Thyroid: No thyromegaly.     Vascular: No JVD.     Trachea: No tracheal deviation.  Cardiovascular:     Rate and Rhythm: Normal rate and regular rhythm.     Heart sounds: Normal heart sounds. No murmur. No friction rub. No gallop.   Pulmonary:     Effort: Pulmonary effort is normal. No respiratory distress.     Breath sounds: Normal breath sounds. No wheezing, rhonchi or rales.  Abdominal:     General: Abdomen is flat. Bowel sounds are normal.     Palpations: Abdomen is soft. There is no mass.     Tenderness: There is no abdominal tenderness. There is no guarding or rebound.  Musculoskeletal:        General: No tenderness. Normal range of motion.     Cervical back: Normal range of motion and neck supple.  Lymphadenopathy:     Cervical: No cervical adenopathy.  Skin:    General: Skin is warm.     Capillary Refill: Capillary refill takes less than 2 seconds.     Findings: No rash.  Neurological:     Mental Status: She is alert and oriented to person, place, and time.     Cranial Nerves: No cranial nerve deficit.     Deep Tendon Reflexes: Reflexes normal.    Psychiatric:        Mood and Affect: Mood is not anxious or depressed.     Wt Readings from Last 3 Encounters:  01/11/19 142 lb (64.4 kg)  12/20/18 136 lb (61.7 kg)  11/16/18 138 lb (62.6 kg)    BP 130/80   Pulse 64   Ht 5\' 4"  (1.626 m)   Wt 142 lb (64.4 kg)   LMP 01/06/2019   BMI 24.37 kg/m   Assessment and Plan:  1. Anxiety disorder due to known physiological condition PHQ 0 gad score 0.  Chronic.  Controlled.  Stable.  We will continue sertraline 25 mg (one half 50 mg).  Will recheck in 6 months. - sertraline (  ZOLOFT) 50 MG tablet; Take 1 tablet (50 mg total) by mouth daily.  Dispense: 90 tablet; Refill: 1  2. Essential hypertension Chronic.  Controlled.  Stable.  Continue hydrochlorothiazide 12.5 mg once a day.  Will check renal function panel for GFR. - hydrochlorothiazide (HYDRODIURIL) 12.5 MG tablet; TAKE 1 TABLET(12.5 MG) BY MOUTH DAILY  Dispense: 90 tablet; Refill: 1 - Renal Function Panel  3. Familial hypercholesterolemia-                                                  chronic.  Controlled.  Stable.  Patient does have elevated LDLs but when we look at HDLs and look at the ratio is she is at about three quarters of the risk.  We discussed lowering LDLs with statin but we are going to recheck this and decide whether or not we can use just diet and effort comfortable with that or whether we proceed with further lowering with a statin. - Lipid Panel With LDL/HDL Ratio

## 2019-01-12 LAB — RENAL FUNCTION PANEL
Albumin: 4.7 g/dL (ref 3.8–4.8)
BUN/Creatinine Ratio: 21 (ref 9–23)
BUN: 18 mg/dL (ref 6–20)
CO2: 25 mmol/L (ref 20–29)
Calcium: 9.6 mg/dL (ref 8.7–10.2)
Chloride: 101 mmol/L (ref 96–106)
Creatinine, Ser: 0.87 mg/dL (ref 0.57–1.00)
GFR calc Af Amer: 97 mL/min/{1.73_m2} (ref >59)
GFR calc non Af Amer: 84 mL/min/{1.73_m2} (ref >59)
Glucose: 94 mg/dL (ref 65–99)
Phosphorus: 3.5 mg/dL (ref 3.0–4.3)
Potassium: 4.6 mmol/L (ref 3.5–5.2)
Sodium: 138 mmol/L (ref 134–144)

## 2019-01-12 LAB — LIPID PANEL WITH LDL/HDL RATIO
Cholesterol, Total: 224 mg/dL — ABNORMAL HIGH (ref 100–199)
HDL: 59 mg/dL (ref >39)
LDL Chol Calc (NIH): 151 mg/dL — ABNORMAL HIGH (ref 0–99)
LDL/HDL Ratio: 2.6 ratio (ref 0.0–3.2)
Triglycerides: 82 mg/dL (ref 0–149)
VLDL Cholesterol Cal: 14 mg/dL (ref 5–40)

## 2019-01-14 ENCOUNTER — Other Ambulatory Visit: Payer: Self-pay

## 2019-01-14 DIAGNOSIS — E782 Mixed hyperlipidemia: Secondary | ICD-10-CM

## 2019-01-14 MED ORDER — ATORVASTATIN CALCIUM 10 MG PO TABS: 10.0000 mg | ORAL_TABLET | Freq: Every day | ORAL | 1 refills | Status: DC

## 2019-01-14 NOTE — Progress Notes (Unsigned)
atorv sent in

## 2019-01-22 ENCOUNTER — Ambulatory Visit: Payer: PRIVATE HEALTH INSURANCE | Attending: Internal Medicine

## 2019-01-22 DIAGNOSIS — Z20822 Contact with and (suspected) exposure to covid-19: Secondary | ICD-10-CM

## 2019-01-23 LAB — NOVEL CORONAVIRUS, NAA: SARS-CoV-2, NAA: NOT DETECTED

## 2019-02-22 ENCOUNTER — Ambulatory Visit: Payer: PRIVATE HEALTH INSURANCE | Admitting: Family Medicine

## 2019-02-22 ENCOUNTER — Encounter: Payer: Self-pay | Admitting: Family Medicine

## 2019-02-22 ENCOUNTER — Ambulatory Visit (INDEPENDENT_AMBULATORY_CARE_PROVIDER_SITE_OTHER): Payer: PRIVATE HEALTH INSURANCE | Admitting: Family Medicine

## 2019-02-22 ENCOUNTER — Other Ambulatory Visit: Payer: Self-pay

## 2019-02-22 VITALS — BP 130/70 | HR 76 | Ht 64.0 in | Wt 143.0 lb

## 2019-02-22 DIAGNOSIS — M546 Pain in thoracic spine: Secondary | ICD-10-CM

## 2019-02-22 DIAGNOSIS — E7801 Familial hypercholesterolemia: Secondary | ICD-10-CM

## 2019-02-22 LAB — POCT URINALYSIS DIPSTICK
Bilirubin, UA: NEGATIVE
Blood, UA: NEGATIVE
Glucose, UA: NEGATIVE
Ketones, UA: NEGATIVE
Leukocytes, UA: NEGATIVE
Nitrite, UA: NEGATIVE
Protein, UA: NEGATIVE
Spec Grav, UA: 1.01 (ref 1.010–1.025)
Urobilinogen, UA: 0.2 E.U./dL
pH, UA: 5 (ref 5.0–8.0)

## 2019-02-22 NOTE — Progress Notes (Signed)
Date:  02/22/2019   Name:  Denise Clarke   DOB:  11-13-1980   MRN:  [id number]   Chief Complaint: Hyperlipidemia (did not start chol med)  Hyperlipidemia This is a chronic problem. The current episode started more than 1 year ago. The problem is uncontrolled. Recent lipid tests were reviewed and are high. She has no history of chronic renal disease, diabetes, hypothyroidism, liver disease or obesity. Pertinent negatives include no chest pain, focal sensory loss, focal weakness, leg pain, myalgias or shortness of breath. Current antihyperlipidemic treatment includes diet change. The current treatment provides moderate improvement of lipids. There are no compliance problems.  Risk factors for coronary artery disease include dyslipidemia and hypertension.  Back Pain This is a new problem. The current episode started in the past 7 days (wednesday). The problem occurs constantly. The pain is present in the thoracic spine (cva). Pertinent negatives include no abdominal pain, bladder incontinence, bowel incontinence, chest pain, dysuria, fever, headaches, leg pain, numbness, pelvic pain or weakness.    Lab Results  Component Value Date   CREATININE 0.87 01/11/2019   BUN 18 01/11/2019   NA 138 01/11/2019   K 4.6 01/11/2019   CL 101 01/11/2019   CO2 25 01/11/2019   Lab Results  Component Value Date   CHOL 224 (H) 01/11/2019   HDL 59 01/11/2019   LDLCALC 151 (H) 01/11/2019   TRIG 82 01/11/2019   No results found for: TSH No results found for: HGBA1C   Review of Systems  Constitutional: Negative.  Negative for chills, fatigue, fever and unexpected weight change.  HENT: Negative for congestion, ear discharge, ear pain, rhinorrhea, sinus pressure, sneezing and sore throat.   Eyes: Negative for photophobia, pain, discharge, redness and itching.  Respiratory: Negative for cough, shortness of breath, wheezing and stridor.   Cardiovascular: Negative for chest pain.  Gastrointestinal:  Negative for abdominal pain, blood in stool, bowel incontinence, constipation, diarrhea, nausea and vomiting.  Endocrine: Negative for cold intolerance, heat intolerance, polydipsia, polyphagia and polyuria.  Genitourinary: Negative for bladder incontinence, dysuria, flank pain, frequency, hematuria, menstrual problem, pelvic pain, urgency, vaginal bleeding and vaginal discharge.  Musculoskeletal: Positive for back pain. Negative for arthralgias, myalgias and neck pain.  Skin: Negative for rash.  Allergic/Immunologic: Negative for environmental allergies and food allergies.  Neurological: Negative for dizziness, focal weakness, weakness, light-headedness, numbness and headaches.  Hematological: Negative for adenopathy. Does not bruise/bleed easily.  Psychiatric/Behavioral: Negative for dysphoric mood. The patient is not nervous/anxious.     Patient Active Problem List   Diagnosis Date Noted  . History of cervical dysplasia 11/11/2016  . Hypertension, postpartum condition or complication 99991111  . History of classical cesarean section 05/25/2016  . History of cesarean section, classical 03/11/2016  . Anxiety disorder due to known physiological condition 04/18/2014  . Bacterial upper respiratory infection 04/18/2014    Allergies  Allergen Reactions  . Sulfa Antibiotics Hives    As a child    Past Surgical History:  Procedure Laterality Date  . BUNIONECTOMY    . CESAREAN SECTION  2013  . CESAREAN SECTION WITH BILATERAL TUBAL LIGATION Bilateral 05/25/2016   Procedure: REPEAT CESAREAN SECTION WITH BILATERAL TUBAL LIGATION;  Surgeon: Gae Dry, MD;  Location: [facility] ORS;  Service: Obstetrics;  Laterality: Bilateral;  Female @ 1550 Apgars: 8/9 Weight: 7lb  2oz  . LAPAROSCOPY  2007    Social History   Tobacco Use  . Smoking status: Never Smoker  . Smokeless tobacco: Never Used  Substance Use Topics  . Alcohol use: No    Alcohol/week: 0.0 standard drinks  . Drug use: No       Medication list has been reviewed and updated.  Current Meds  Medication Sig  . hydrochlorothiazide (HYDRODIURIL) 12.5 MG tablet TAKE 1 TABLET(12.5 MG) BY MOUTH DAILY  . sertraline (ZOLOFT) 50 MG tablet Take 1 tablet (50 mg total) by mouth daily.    PHQ 2/9 Scores 02/22/2019 01/11/2019 12/20/2018 08/03/2018  PHQ - 2 Score 0 0 0 0  PHQ- 9 Score 0 0 0 0    BP Readings from Last 3 Encounters:  02/22/19 130/70  01/11/19 130/80  12/20/18 (!) 134/95    Physical Exam Vitals and nursing note reviewed.  Constitutional:      General: She is not in acute distress.    Appearance: She is not diaphoretic.  HENT:     Head: Normocephalic and atraumatic.     Right Ear: Tympanic membrane, ear canal and external ear normal. There is no impacted cerumen.     Left Ear: Tympanic membrane, ear canal and external ear normal. There is no impacted cerumen.     Nose: Nose normal.     Mouth/Throat:     Mouth: Mucous membranes are moist.  Eyes:     General:        Right eye: No discharge.        Left eye: No discharge.     Conjunctiva/sclera: Conjunctivae normal.     Pupils: Pupils are equal, round, and reactive to light.  Neck:     Thyroid: No thyromegaly.     Vascular: No JVD.  Cardiovascular:     Rate and Rhythm: Normal rate and regular rhythm.     Heart sounds: Normal heart sounds. No murmur. No friction rub. No gallop.   Pulmonary:     Effort: Pulmonary effort is normal.     Breath sounds: Normal breath sounds. No wheezing or rhonchi.  Abdominal:     General: Bowel sounds are normal.     Palpations: Abdomen is soft. There is no mass.     Tenderness: There is abdominal tenderness in the suprapubic area. There is left CVA tenderness. There is no right CVA tenderness or guarding.  Musculoskeletal:        General: Normal range of motion.     Cervical back: Normal range of motion and neck supple.  Lymphadenopathy:     Cervical: No cervical adenopathy.  Skin:    General: Skin is warm  and dry.  Neurological:     Mental Status: She is alert.     Deep Tendon Reflexes: Reflexes are normal and symmetric.     Wt Readings from Last 3 Encounters:  02/22/19 143 lb (64.9 kg)  01/11/19 142 lb (64.4 kg)  12/20/18 136 lb (61.7 kg)    BP 130/70   Pulse 76   Ht 5\' 4"  (1.626 m)   Wt 143 lb (64.9 kg)   LMP 01/30/2019 (Exact Date)   BMI 24.55 kg/m   Assessment and Plan:  1. Familial hypercholesterolemia Chronic.  Persistent.  Stable.  Patient's ratio puts her at a reduced risk at about half risk for an untoward event.  Patient has been doing better with her diet and did not take her atorvastatin.  We will recheck lipid panel and see where we are at this stage we have had discussions at about risk and the benefit of lowering her LDL in a more acceptable range and she  understands that is a matter of numbers and not necessarily an absolute medication solution.  Pending lipid panel that we checked today we will discuss whether or not to resume her atorvastatin or not. - Lipid Panel With LDL/HDL Ratio  2. Acute left-sided thoracic back pain Patient had some left CVA discomfort with some mild tenderness but there was also spasm of the paraspinals muscle.  Also on exam has some discomfort over the suprapubic area.  Urinalysis did not note any infection and we will treat as a muscular skeletal concern. - POCT urinalysis dipstick

## 2019-02-23 LAB — LIPID PANEL WITH LDL/HDL RATIO
Cholesterol, Total: 216 mg/dL — ABNORMAL HIGH (ref 100–199)
HDL: 58 mg/dL (ref >39)
LDL Chol Calc (NIH): 142 mg/dL — ABNORMAL HIGH (ref 0–99)
LDL/HDL Ratio: 2.4 ratio (ref 0.0–3.2)
Triglycerides: 92 mg/dL (ref 0–149)
VLDL Cholesterol Cal: 16 mg/dL (ref 5–40)

## 2019-03-14 ENCOUNTER — Other Ambulatory Visit: Payer: Self-pay

## 2019-03-14 ENCOUNTER — Telehealth: Payer: Self-pay

## 2019-03-14 DIAGNOSIS — J0100 Acute maxillary sinusitis, unspecified: Secondary | ICD-10-CM

## 2019-03-14 MED ORDER — AZITHROMYCIN 250 MG PO TABS: ORAL_TABLET | ORAL | 0 refills | Status: DC

## 2019-03-14 NOTE — Progress Notes (Unsigned)
Sent in [place] to [facility]

## 2019-03-14 NOTE — Telephone Encounter (Signed)
Pt called in asking for an antibiotic since she was just seen in office. Sent in [place] to [facility]- need to see if not better

## 2019-03-29 ENCOUNTER — Other Ambulatory Visit: Payer: Self-pay

## 2019-03-29 ENCOUNTER — Other Ambulatory Visit: Payer: PRIVATE HEALTH INSURANCE

## 2019-03-29 DIAGNOSIS — E7801 Familial hypercholesterolemia: Secondary | ICD-10-CM

## 2019-03-29 DIAGNOSIS — R69 Illness, unspecified: Secondary | ICD-10-CM

## 2019-03-30 LAB — LIPID PANEL WITH LDL/HDL RATIO
Cholesterol, Total: 185 mg/dL (ref 100–199)
HDL: 57 mg/dL (ref >39)
LDL Chol Calc (NIH): 117 mg/dL — ABNORMAL HIGH (ref 0–99)
LDL/HDL Ratio: 2.1 ratio (ref 0.0–3.2)
Triglycerides: 56 mg/dL (ref 0–149)
VLDL Cholesterol Cal: 11 mg/dL (ref 5–40)

## 2019-03-30 LAB — HEPATIC FUNCTION PANEL
ALT: 19 IU/L (ref 0–32)
AST: 18 IU/L (ref 0–40)
Albumin: 4.8 g/dL (ref 3.8–4.8)
Alkaline Phosphatase: 59 IU/L (ref 39–117)
Bilirubin Total: 0.7 mg/dL (ref 0.0–1.2)
Bilirubin, Direct: 0.2 mg/dL (ref 0.00–0.40)
Total Protein: 7 g/dL (ref 6.0–8.5)

## 2019-04-25 ENCOUNTER — Other Ambulatory Visit: Payer: Self-pay

## 2019-04-25 DIAGNOSIS — E782 Mixed hyperlipidemia: Secondary | ICD-10-CM

## 2019-04-25 MED ORDER — ATORVASTATIN CALCIUM 10 MG PO TABS: 10.0000 mg | ORAL_TABLET | Freq: Every day | ORAL | 1 refills | Status: DC

## 2019-06-28 ENCOUNTER — Other Ambulatory Visit: Payer: Self-pay

## 2019-06-28 ENCOUNTER — Encounter: Payer: Self-pay | Admitting: Family Medicine

## 2019-06-28 ENCOUNTER — Ambulatory Visit (INDEPENDENT_AMBULATORY_CARE_PROVIDER_SITE_OTHER): Payer: PRIVATE HEALTH INSURANCE | Admitting: Family Medicine

## 2019-06-28 VITALS — BP 132/84 | HR 79 | Ht 64.0 in | Wt 133.0 lb

## 2019-06-28 DIAGNOSIS — J4521 Mild intermittent asthma with (acute) exacerbation: Secondary | ICD-10-CM | POA: Diagnosis not present

## 2019-06-28 DIAGNOSIS — J0100 Acute maxillary sinusitis, unspecified: Secondary | ICD-10-CM

## 2019-06-28 DIAGNOSIS — R059 Cough, unspecified: Secondary | ICD-10-CM

## 2019-06-28 DIAGNOSIS — R05 Cough: Secondary | ICD-10-CM

## 2019-06-28 MED ORDER — GUAIFENESIN-CODEINE 100-10 MG/5ML PO SYRP: 5.0000 mL | ORAL_SOLUTION | Freq: Three times a day (TID) | ORAL | 0 refills | Status: DC | PRN

## 2019-06-28 MED ORDER — AZITHROMYCIN 250 MG PO TABS: ORAL_TABLET | ORAL | 0 refills | Status: DC

## 2019-06-28 MED ORDER — ALBUTEROL SULFATE HFA 108 (90 BASE) MCG/ACT IN AERS: 2.0000 | INHALATION_SPRAY | Freq: Four times a day (QID) | RESPIRATORY_TRACT | 0 refills | Status: DC | PRN

## 2019-06-28 NOTE — Progress Notes (Signed)
Date:  06/28/2019   Name:  Denise Clarke   DOB:  [birth year]   MRN:  [id number]   Chief Complaint: Sinusitis (x1 week, lost voice, coughing up green mucous, runny nose, hoarse, no fever )  Sinusitis This is a new problem. The current episode started in the past 7 days. The problem has been gradually worsening since onset. There has been no fever. She is experiencing no pain. Associated symptoms include congestion, coughing, a hoarse voice, shortness of breath and sinus pressure. Pertinent negatives include no chills, diaphoresis, ear pain, headaches, neck pain, sneezing, sore throat or swollen glands. Past treatments include nothing. The treatment provided mild relief.    Lab Results  Component Value Date   CREATININE 0.87 01/11/2019   BUN 18 01/11/2019   NA 138 01/11/2019   K 4.6 01/11/2019   CL 101 01/11/2019   CO2 25 01/11/2019   Lab Results  Component Value Date   CHOL 185 03/29/2019   HDL 57 03/29/2019   LDLCALC 117 (H) 03/29/2019   TRIG 56 03/29/2019   No results found for: TSH No results found for: HGBA1C Lab Results  Component Value Date   WBC 7.2 05/26/2016   HGB 9.2 (L) 05/26/2016   HCT 27.4 (L) 05/26/2016   MCV 83.1 05/26/2016   PLT 157 05/26/2016   Lab Results  Component Value Date   ALT 19 03/29/2019   AST 18 03/29/2019   ALKPHOS 59 03/29/2019   BILITOT 0.7 03/29/2019     Review of Systems  Constitutional: Negative.  Negative for chills, diaphoresis, fatigue, fever and unexpected weight change.  HENT: Positive for congestion, hoarse voice, postnasal drip and sinus pressure. Negative for ear discharge, ear pain, nosebleeds, rhinorrhea, sneezing and sore throat.   Eyes: Negative for photophobia, pain, discharge, redness and itching.  Respiratory: Positive for cough and shortness of breath. Negative for wheezing and stridor.   Cardiovascular: Negative for chest pain, palpitations and leg swelling.  Gastrointestinal: Negative for abdominal pain,  blood in stool, constipation, diarrhea, nausea and vomiting.  Endocrine: Negative for cold intolerance, heat intolerance, polydipsia, polyphagia and polyuria.  Genitourinary: Negative for dysuria, flank pain, frequency, hematuria, menstrual problem, pelvic pain, urgency, vaginal bleeding and vaginal discharge.  Musculoskeletal: Negative for arthralgias, back pain, myalgias and neck pain.  Skin: Negative for rash.  Allergic/Immunologic: Negative for environmental allergies and food allergies.  Neurological: Negative for dizziness, weakness, light-headedness, numbness and headaches.  Hematological: Negative for adenopathy. Does not bruise/bleed easily.  Psychiatric/Behavioral: Negative for dysphoric mood. The patient is not nervous/anxious.     Patient Active Problem List   Diagnosis Date Noted   History of cervical dysplasia 11/11/2016   Hypertension, postpartum condition or complication 54/62/7035   History of classical cesarean section 05/25/2016   History of cesarean section, classical 03/11/2016   Anxiety disorder due to known physiological condition 04/18/2014   Bacterial upper respiratory infection 04/18/2014    Allergies  Allergen Reactions   Sulfa Antibiotics Hives    As a child    Past Surgical History:  Procedure Laterality Date   BUNIONECTOMY     CESAREAN SECTION  2013   CESAREAN SECTION WITH BILATERAL TUBAL LIGATION Bilateral 05/25/2016   Procedure: REPEAT CESAREAN SECTION WITH BILATERAL TUBAL LIGATION;  Surgeon: Gae Dry, MD;  Location: [facility] ORS;  Service: Obstetrics;  Laterality: Bilateral;  Female @ 1550 Apgars: 8/9 Weight: 7lb  2oz   LAPAROSCOPY  2007    Social History   Tobacco Use  Smoking status: Never Smoker   Smokeless tobacco: Never Used  Vaping Use   Vaping Use: Never used  Substance Use Topics   Alcohol use: No    Alcohol/week: 0.0 standard drinks   Drug use: No     Medication list has been reviewed and  updated.  Current Meds  Medication Sig   atorvastatin (LIPITOR) 10 MG tablet Take 1 tablet (10 mg total) by mouth daily.   hydrochlorothiazide (HYDRODIURIL) 12.5 MG tablet TAKE 1 TABLET(12.5 MG) BY MOUTH DAILY   sertraline (ZOLOFT) 50 MG tablet Take 1 tablet (50 mg total) by mouth daily.    PHQ 2/9 Scores 06/28/2019 02/22/2019 01/11/2019 12/20/2018  PHQ - 2 Score 0 0 0 0  PHQ- 9 Score 0 0 0 0    GAD 7 : Generalized Anxiety Score 06/28/2019 02/22/2019 01/11/2019  Nervous, Anxious, on Edge 0 0 0  Control/stop worrying 0 0 0  Worry too much - different things 0 0 0  Trouble relaxing 0 0 0  Restless 0 0 0  Easily annoyed or irritable 0 0 0  Afraid - awful might happen 0 0 0  Total GAD 7 Score 0 0 0  Anxiety Difficulty Not difficult at all - -    BP Readings from Last 3 Encounters:  06/28/19 132/84  02/22/19 130/70  01/11/19 130/80    Physical Exam Vitals and nursing note reviewed.  Constitutional:      General: She is not in acute distress.    Appearance: She is not diaphoretic.  HENT:     Head: Normocephalic and atraumatic.     Jaw: There is normal jaw occlusion.     Right Ear: Hearing, tympanic membrane, ear canal and external ear normal. There is no impacted cerumen.     Left Ear: Hearing, tympanic membrane, ear canal and external ear normal. There is no impacted cerumen.     Nose: No congestion or rhinorrhea.     Right Turbinates: Enlarged.     Left Turbinates: Enlarged.     Right Sinus: Maxillary sinus tenderness present. No frontal sinus tenderness.     Left Sinus: No maxillary sinus tenderness or frontal sinus tenderness.     Mouth/Throat:     Lips: Pink.     Mouth: Mucous membranes are moist.     Palate: No mass.     Pharynx: Oropharynx is clear. Uvula midline.  Eyes:     General:        Right eye: No discharge.        Left eye: No discharge.     Extraocular Movements: Extraocular movements intact.     Conjunctiva/sclera: Conjunctivae normal.     Pupils:  Pupils are equal, round, and reactive to light.  Neck:     Thyroid: No thyromegaly.     Vascular: No JVD.  Cardiovascular:     Rate and Rhythm: Normal rate and regular rhythm.     Pulses: Normal pulses.     Heart sounds: Normal heart sounds. No murmur heard.  No friction rub. No gallop.   Pulmonary:     Effort: Pulmonary effort is normal.     Breath sounds: Normal breath sounds. No wheezing, rhonchi or rales.     Comments: Increased E/I Abdominal:     General: Bowel sounds are normal.     Palpations: Abdomen is soft. There is no mass.     Tenderness: There is no abdominal tenderness. There is no right CVA tenderness, left CVA tenderness, guarding or  rebound.  Musculoskeletal:        General: Normal range of motion.     Cervical back: Normal range of motion and neck supple.  Lymphadenopathy:     Cervical: No cervical adenopathy.  Skin:    General: Skin is warm and dry.     Capillary Refill: Capillary refill takes less than 2 seconds.  Neurological:     General: No focal deficit present.     Mental Status: She is alert and oriented to person, place, and time.     Cranial Nerves: No cranial nerve deficit.     Sensory: No sensory deficit.     Deep Tendon Reflexes: Reflexes are normal and symmetric.     Wt Readings from Last 3 Encounters:  06/28/19 133 lb (60.3 kg)  02/22/19 143 lb (64.9 kg)  01/11/19 142 lb (64.4 kg)    BP 132/84    Pulse 79    Ht 5\' 4"  (1.626 m)    Wt 133 lb (60.3 kg)    SpO2 97%    BMI 22.83 kg/m   Assessment and Plan: 1. Acute maxillary sinusitis, recurrence not specified Acute.  Persistent.  Episodic productive nasal drainage/yellow.  Initiate a azithromycin 2 today followed by 1 a day for 4 days.  2. Cough Acute.  Persistent.  Some difficulty at night with sleeping.  Robitussin-AC 1 teaspoon as needed cough as needed for cough - guaiFENesin-codeine (ROBITUSSIN AC) 100-10 MG/5ML syrup; Take 5 mLs by mouth 3 (three) times daily as needed.  Dispense:  100 mL; Refill: 0  3. Mild intermittent reactive airway disease with acute exacerbation Mild.  Intermittent.  Mild exacerbation at this time.  Ventolin 2 puffs every 6 hours for wheezing. - albuterol (VENTOLIN HFA) 108 (90 Base) MCG/ACT inhaler; Inhale 2 puffs into the lungs every 6 (six) hours as needed for wheezing or shortness of breath.  Dispense: 6.7 g; Refill: 0

## 2019-07-16 ENCOUNTER — Other Ambulatory Visit: Payer: Self-pay

## 2019-07-16 ENCOUNTER — Ambulatory Visit (INDEPENDENT_AMBULATORY_CARE_PROVIDER_SITE_OTHER): Payer: PRIVATE HEALTH INSURANCE | Admitting: Family Medicine

## 2019-07-16 ENCOUNTER — Encounter: Payer: Self-pay | Admitting: Family Medicine

## 2019-07-16 VITALS — BP 120/60 | HR 80 | Ht 64.0 in | Wt 133.0 lb

## 2019-07-16 DIAGNOSIS — F064 Anxiety disorder due to known physiological condition: Secondary | ICD-10-CM

## 2019-07-16 DIAGNOSIS — E782 Mixed hyperlipidemia: Secondary | ICD-10-CM | POA: Diagnosis not present

## 2019-07-16 DIAGNOSIS — I10 Essential (primary) hypertension: Secondary | ICD-10-CM

## 2019-07-16 DIAGNOSIS — M7712 Lateral epicondylitis, left elbow: Secondary | ICD-10-CM | POA: Diagnosis not present

## 2019-07-16 MED ORDER — SERTRALINE HCL 50 MG PO TABS: 50.0000 mg | ORAL_TABLET | Freq: Every day | ORAL | 1 refills | Status: DC

## 2019-07-16 MED ORDER — ATORVASTATIN CALCIUM 10 MG PO TABS: 10.0000 mg | ORAL_TABLET | Freq: Every day | ORAL | 1 refills | Status: DC

## 2019-07-16 MED ORDER — HYDROCHLOROTHIAZIDE 12.5 MG PO TABS: ORAL_TABLET | ORAL | 1 refills | Status: DC

## 2019-07-16 NOTE — Progress Notes (Signed)
Date:  07/16/2019   Name:  Denise Clarke   DOB:  November 24, 1980   MRN:  [id number]   Chief Complaint: Hyperlipidemia, Hypertension, and Depression  Hyperlipidemia This is a chronic problem. The current episode started more than 1 year ago. The problem is controlled. Recent lipid tests were reviewed and are normal. She has no history of chronic renal disease, diabetes, hypothyroidism, liver disease, obesity or nephrotic syndrome. There are no known factors aggravating her hyperlipidemia. Pertinent negatives include no chest pain, focal sensory loss, focal weakness, leg pain, myalgias or shortness of breath. Current antihyperlipidemic treatment includes statins. The current treatment provides mild improvement of lipids. There are no compliance problems.  Risk factors for coronary artery disease include hypertension and dyslipidemia.  Hypertension This is a chronic problem. The current episode started more than 1 year ago. The problem is controlled. Pertinent negatives include no anxiety, blurred vision, chest pain, headaches, malaise/fatigue, neck pain, orthopnea, palpitations, peripheral edema, PND, shortness of breath or sweats. There are no associated agents to hypertension. There are no known risk factors for coronary artery disease. Past treatments include diuretics. The current treatment provides moderate improvement. There is no history of angina, kidney disease, CAD/MI, CVA, heart failure, left ventricular hypertrophy, PVD or retinopathy. There is no history of chronic renal disease, a hypertension causing med or renovascular disease.  Depression        This is a chronic problem.  The current episode started more than 1 year ago.   The problem has been gradually improving since onset.  Associated symptoms include no decreased concentration, no fatigue, no helplessness, no hopelessness, does not have insomnia, not irritable, no restlessness, no decreased interest, no appetite change, no body  aches, no myalgias, no headaches, no indigestion, not sad and no suicidal ideas.  Past treatments include SSRIs - Selective serotonin reuptake inhibitors.  Compliance with treatment is good.  Previous treatment provided moderate relief.   Pertinent negatives include no hypothyroidism and no anxiety.   Lab Results  Component Value Date   CREATININE 0.87 01/11/2019   BUN 18 01/11/2019   NA 138 01/11/2019   K 4.6 01/11/2019   CL 101 01/11/2019   CO2 25 01/11/2019   Lab Results  Component Value Date   CHOL 185 03/29/2019   HDL 57 03/29/2019   LDLCALC 117 (H) 03/29/2019   TRIG 56 03/29/2019   No results found for: TSH No results found for: HGBA1C Lab Results  Component Value Date   WBC 7.2 05/26/2016   HGB 9.2 (L) 05/26/2016   HCT 27.4 (L) 05/26/2016   MCV 83.1 05/26/2016   PLT 157 05/26/2016   Lab Results  Component Value Date   ALT 19 03/29/2019   AST 18 03/29/2019   ALKPHOS 59 03/29/2019   BILITOT 0.7 03/29/2019     Review of Systems  Constitutional: Negative.  Negative for appetite change, chills, fatigue, fever, malaise/fatigue and unexpected weight change.  HENT: Negative for congestion, ear discharge, ear pain, rhinorrhea, sinus pressure, sneezing and sore throat.   Eyes: Negative for blurred vision, photophobia, pain, discharge, redness and itching.  Respiratory: Negative for cough, shortness of breath, wheezing and stridor.   Cardiovascular: Negative for chest pain, palpitations, orthopnea and PND.  Gastrointestinal: Negative for abdominal pain, blood in stool, constipation, diarrhea, nausea and vomiting.  Endocrine: Negative for cold intolerance, heat intolerance, polydipsia, polyphagia and polyuria.  Genitourinary: Negative for dysuria, flank pain, frequency, hematuria, menstrual problem, pelvic pain, urgency, vaginal bleeding and vaginal discharge.  Musculoskeletal: Negative for arthralgias, back pain, myalgias and neck pain.  Skin: Negative for rash.    Allergic/Immunologic: Negative for environmental allergies and food allergies.  Neurological: Negative for dizziness, focal weakness, weakness, light-headedness, numbness and headaches.  Hematological: Negative for adenopathy. Does not bruise/bleed easily.  Psychiatric/Behavioral: Positive for depression. Negative for decreased concentration, dysphoric mood and suicidal ideas. The patient is not nervous/anxious and does not have insomnia.     Patient Active Problem List   Diagnosis Date Noted  . History of cervical dysplasia 11/11/2016  . Hypertension, postpartum condition or complication 10/93/2355  . History of classical cesarean section 05/25/2016  . History of cesarean section, classical 03/11/2016  . Anxiety disorder due to known physiological condition 04/18/2014  . Bacterial upper respiratory infection 04/18/2014    Allergies  Allergen Reactions  . Sulfa Antibiotics Hives    As a child    Past Surgical History:  Procedure Laterality Date  . BUNIONECTOMY    . CESAREAN SECTION  2013  . CESAREAN SECTION WITH BILATERAL TUBAL LIGATION Bilateral 05/25/2016   Procedure: REPEAT CESAREAN SECTION WITH BILATERAL TUBAL LIGATION;  Surgeon: Gae Dry, MD;  Location: [facility];  Service: Obstetrics;  Laterality: Bilateral;  Female @ 1550 Apgars: 8/9 Weight: 7lb  2oz  . LAPAROSCOPY  2007    Social History   Tobacco Use  . Smoking status: Never Smoker  . Smokeless tobacco: Never Used  Vaping Use  . Vaping Use: Never used  Substance Use Topics  . Alcohol use: No    Alcohol/week: 0.0 standard drinks  . Drug use: No     Medication list has been reviewed and updated.  Current Meds  Medication Sig  . albuterol (VENTOLIN HFA) 108 (90 Base) MCG/ACT inhaler Inhale 2 puffs into the lungs every 6 (six) hours as needed for wheezing or shortness of breath.  Marland Kitchen atorvastatin (LIPITOR) 10 MG tablet Take 1 tablet (10 mg total) by mouth daily.  . hydrochlorothiazide (HYDRODIURIL) 12.5  MG tablet TAKE 1 TABLET(12.5 MG) BY MOUTH DAILY  . sertraline (ZOLOFT) 50 MG tablet Take 1 tablet (50 mg total) by mouth daily.    PHQ 2/9 Scores 07/16/2019 06/28/2019 02/22/2019 01/11/2019  PHQ - 2 Score 0 0 0 0  PHQ- 9 Score 0 0 0 0    GAD 7 : Generalized Anxiety Score 07/16/2019 06/28/2019 02/22/2019 01/11/2019  Nervous, Anxious, on Edge 0 0 0 0  Control/stop worrying 0 0 0 0  Worry too much - different things 0 0 0 0  Trouble relaxing 0 0 0 0  Restless 0 0 0 0  Easily annoyed or irritable 0 0 0 0  Afraid - awful might happen 0 0 0 0  Total GAD 7 Score 0 0 0 0  Anxiety Difficulty - Not difficult at all - -    BP Readings from Last 3 Encounters:  07/16/19 120/60  06/28/19 132/84  02/22/19 130/70    Physical Exam Vitals and nursing note reviewed.  Constitutional:      General: She is not irritable.    Appearance: She is well-developed.  HENT:     Head: Normocephalic.     Right Ear: Tympanic membrane, ear canal and external ear normal.     Left Ear: Tympanic membrane, ear canal and external ear normal.     Nose: Nose normal. No congestion or rhinorrhea.     Mouth/Throat:     Mouth: Mucous membranes are moist.     Pharynx: Oropharynx is clear. No  oropharyngeal exudate.  Eyes:     General: Lids are everted, no foreign bodies appreciated. No scleral icterus.       Left eye: No foreign body or hordeolum.     Conjunctiva/sclera: Conjunctivae normal.     Right eye: Right conjunctiva is not injected.     Left eye: Left conjunctiva is not injected.     Pupils: Pupils are equal, round, and reactive to light.  Neck:     Thyroid: No thyromegaly.     Vascular: No JVD.     Trachea: No tracheal deviation.  Cardiovascular:     Rate and Rhythm: Normal rate and regular rhythm.     Heart sounds: Normal heart sounds. No murmur heard.  No friction rub. No gallop.   Pulmonary:     Effort: Pulmonary effort is normal. No respiratory distress.     Breath sounds: Normal breath sounds. No  wheezing, rhonchi or rales.  Abdominal:     General: Bowel sounds are normal.     Palpations: Abdomen is soft. There is no mass.     Tenderness: There is no abdominal tenderness. There is no guarding or rebound.  Musculoskeletal:        General: Normal range of motion.     Right elbow: No swelling, deformity or effusion. Normal range of motion. Tenderness present in lateral epicondyle.     Cervical back: Normal range of motion and neck supple.  Lymphadenopathy:     Cervical: No cervical adenopathy.  Skin:    General: Skin is warm.     Capillary Refill: Capillary refill takes less than 2 seconds.     Coloration: Skin is not jaundiced or pale.     Findings: No bruising, erythema, lesion or rash.  Neurological:     Mental Status: She is alert and oriented to person, place, and time.     Cranial Nerves: No cranial nerve deficit.     Deep Tendon Reflexes: Reflexes normal.  Psychiatric:        Mood and Affect: Mood is not anxious or depressed.     Wt Readings from Last 3 Encounters:  07/16/19 133 lb (60.3 kg)  06/28/19 133 lb (60.3 kg)  02/22/19 143 lb (64.9 kg)    BP 120/60   Pulse 80   Ht 5\' 4"  (1.626 m)   Wt 133 lb (60.3 kg)   BMI 22.83 kg/m   Assessment and Plan:  1. Essential hypertension Chronic.  Controlled.  Stable.  Blood pressure today is 120/60.  We will continue hydrochlorothiazide 12.5 mg once a day.  Review of patient's renal function panel in January was normal and we will wait and repeat it in 6 months - hydrochlorothiazide (HYDRODIURIL) 12.5 MG tablet; TAKE 1 TABLET(12.5 MG) BY MOUTH DAILY  Dispense: 90 tablet; Refill: 1  2. Mixed hyperlipidemia Chronic.  Controlled.  Stable.  Continue atorvastatin 10 mg once a day.  Review of lipid panel that was done March 2021 was in acceptable range. - atorvastatin (LIPITOR) 10 MG tablet; Take 1 tablet (10 mg total) by mouth daily.  Dispense: 90 tablet; Refill: 1  3. Anxiety disorder due to known physiological  condition Chronic.  Controlled.  Stable.  Gad score 0.  Continue sertraline 50 mg once a day. - sertraline (ZOLOFT) 50 MG tablet; Take 1 tablet (50 mg total) by mouth daily.  Dispense: 90 tablet; Refill: 1  4. Lateral epicondylitis of left elbow Acute.  Uncontrolled.  Relatively stable unless repetitive wrist motion such as  yard work.  Exam and history is consistent with lateral epicondylitis.  Patient's been instructed to use over-the-counter NSAIDs along with Voltaren gel.  And a tennis elbow strap.

## 2019-07-16 NOTE — Patient Instructions (Signed)
Tennis Elbow    Tennis elbow (lateral epicondylitis) is inflammation of tendons in your outer forearm, near your elbow. Tendons are tissues that connect muscle to bone. When you have tennis elbow, inflammation affects the tendons that you use to bend your wrist and move your hand up. Inflammation occurs in the lower part of the upper arm bone (humerus), where the tendons connect to the bone (lateral epicondyle).  Tennis elbow often affects people who play tennis, but anyone may get the condition from repeatedly extending the wrist or turning the forearm.  What are the causes?  This condition is usually caused by repeatedly extending the wrist, turning the forearm, and using the hands. It can result from sports or work that requires repetitive forearm movements. In some cases, it may be caused by a sudden injury.  What increases the risk?  You are more likely to develop tennis elbow if you play tennis or another racket sport. You also have a higher risk if you frequently use your hands for work. Besides people who play tennis, others at greater risk include:  Musicians.  Carpenters, painters, and plumbers.  Cooks.  Cashiers.  People who work in factories.  Construction workers.  Butchers.  People who use computers.  What are the signs or symptoms?  Symptoms of this condition include:  Pain and tenderness in the forearm and the outer part of the elbow. Pain may be felt only when using the arm, or it may be there all the time.  A burning feeling that starts in the elbow and spreads down the forearm.  A weak grip in the hand.  How is this diagnosed?  This condition may be diagnosed based on:  Your symptoms and medical history.  A physical exam.  X-rays.  MRI.  How is this treated?  Resting and icing your arm is often the first treatment. Your health care provider may also recommend:  Medicines to reduce pain and inflammation. These may be in the form of a pill, topical gels, or shots of a steroid medicine  (cortisone).  An elbow strap to reduce stress on the area.  Physical therapy. This may include massage or exercises.  An elbow brace to restrict the movements that cause symptoms.  If these treatments do not help relieve your symptoms, your health care provider may recommend surgery to remove damaged muscle and reattach healthy muscle to bone.  Follow these instructions at home:  Activity  Rest your elbow and wrist and avoid activities that cause symptoms, as told by your health care provider.  Do physical therapy exercises as instructed.  If you lift an object, lift it with your palm facing up. This reduces stress on your elbow.  Lifestyle  If your tennis elbow is caused by sports, check your equipment and make sure that:  You are using it correctly.  It is the best fit for you.  If your tennis elbow is caused by work or computer use, take frequent breaks to stretch your arm. Talk with your manager about ways to manage your condition at work.  If you have a brace:  Wear the brace or strap as told by your health care provider. Remove it only as told by your health care provider.  Loosen the brace if your fingers tingle, become numb, or turn cold and blue.  Keep the brace clean.  If the brace is not waterproof, ask if you may remove it for bathing. If you must keep the brace on while bathing:    bath or a shower. General instructions   If directed, put ice on the painful area: ? Put ice in a plastic bag. ? Place a towel between your skin and the bag. ? Leave the ice on for 20 minutes, 2-3 times a day.  Take over-the-counter and prescription medicines only as told by your health care provider.  Keep all follow-up visits as told by your health care provider. This is important. Contact a health care provider if:  You have pain that gets worse or does not get better with  treatment.  You have numbness or weakness in your forearm, hand, or fingers. Summary  Tennis elbow (lateral epicondylitis) is inflammation of tendons in your outer forearm, near your elbow.  Common symptoms include pain and tenderness in your forearm and the outer part of your elbow.  This condition is usually caused by repeatedly extending your wrist, turning your forearm, and using your hands.  The first treatment is often resting and icing your arm to relieve symptoms. Further treatment may include taking medicine, getting physical therapy, wearing a brace or strap, or having surgery. This information is not intended to replace advice given to you by your health care provider. Make sure you discuss any questions you have with your health care provider. Document Revised: 09/15/2017 Document Reviewed: 10/04/2016 Elsevier Patient Education  2020 [employer]  

## 2019-08-28 ENCOUNTER — Other Ambulatory Visit: Payer: Self-pay | Admitting: Family Medicine

## 2019-08-28 DIAGNOSIS — R059 Cough, unspecified: Secondary | ICD-10-CM

## 2019-11-20 ENCOUNTER — Telehealth: Payer: Self-pay

## 2019-11-20 NOTE — Telephone Encounter (Signed)
Copied from [place] [id number]. Topic: General - Other >> Nov 20, 2019 10:20 AM Antonieta Iba C wrote: Reason for CRM: pt called in for assistance. Pt says that she has a history of getting sinus infections. Pt says that she has congestion but no sore throat or other symptoms. Pt would like to know if provider could send in a Rx to help?    Pharmacy: [facility] [place], [address] AT [place]  Phone:  [phone] Fax:  [phone]    Phone:  [phone]

## 2019-11-20 NOTE — Telephone Encounter (Signed)
Patient will need to be seen for evaluation - we cannot just call in antibiotics. Please schedule her an appt with Dr Ronnald Ramp to be seen tomorrow. Thank you.

## 2019-11-21 ENCOUNTER — Ambulatory Visit: Payer: PRIVATE HEALTH INSURANCE | Admitting: Family Medicine

## 2019-11-22 ENCOUNTER — Ambulatory Visit (INDEPENDENT_AMBULATORY_CARE_PROVIDER_SITE_OTHER): Payer: PRIVATE HEALTH INSURANCE | Admitting: Obstetrics & Gynecology

## 2019-11-22 ENCOUNTER — Encounter: Payer: Self-pay | Admitting: Obstetrics & Gynecology

## 2019-11-22 ENCOUNTER — Other Ambulatory Visit (HOSPITAL_COMMUNITY)
Admission: RE | Admit: 2019-11-22 | Discharge: 2019-11-22 | Disposition: A | Discharge status: Home / Self Care | Payer: PRIVATE HEALTH INSURANCE | Source: Ambulatory Visit | Attending: Obstetrics & Gynecology | Admitting: Obstetrics & Gynecology

## 2019-11-22 ENCOUNTER — Other Ambulatory Visit: Payer: Self-pay

## 2019-11-22 VITALS — BP 120/70 | Ht 64.0 in | Wt 145.0 lb

## 2019-11-22 DIAGNOSIS — Z124 Encounter for screening for malignant neoplasm of cervix: Secondary | ICD-10-CM

## 2019-11-22 DIAGNOSIS — Z1231 Encounter for screening mammogram for malignant neoplasm of breast: Secondary | ICD-10-CM | POA: Diagnosis not present

## 2019-11-22 DIAGNOSIS — Z01419 Encounter for gynecological examination (general) (routine) without abnormal findings: Secondary | ICD-10-CM

## 2019-11-22 NOTE — Progress Notes (Signed)
HPI:      [name] is a 39 y.o. [id number] who LMP was Patient's last menstrual period was 11/15/2019., she presents today for her annual examination. The patient has no complaints today. The patient is sexually active. Her last pap: approximate date 2018 and was normal and last mammogram: baseline age 53 normal. The patient does perform self breast exams.  There is no notable family history of breast or ovarian cancer in her family.  The patient has regular exercise: yes.  The patient denies current symptoms of depression.    GYN History: Contraception: tubal ligation  Reg cycles  PMHx: Past Medical History:  Diagnosis Date  . AMA (advanced maternal age) multigravida 37+   . Esophageal reflux   . Headache    migraine  . Hx of pre-eclampsia in prior pregnancy, currently pregnant   . Type O blood, Rh negative    Past Surgical History:  Procedure Laterality Date  . BUNIONECTOMY    . CESAREAN SECTION  2013  . CESAREAN SECTION WITH BILATERAL TUBAL LIGATION Bilateral 05/25/2016   Procedure: REPEAT CESAREAN SECTION WITH BILATERAL TUBAL LIGATION;  Surgeon: Gae Dry, MD;  Location: [facility] ORS;  Service: Obstetrics;  Laterality: Bilateral;  Female @ 1550 Apgars: 8/9 Weight: 7lb  2oz  . LAPAROSCOPY  2007   Family History  Problem Relation Age of Onset  . Diabetes Father   . Hypertension Father   . Cancer Brother        leukemia  . Breast cancer Maternal Grandmother 51   Social History   Tobacco Use  . Smoking status: Never Smoker  . Smokeless tobacco: Never Used  Vaping Use  . Vaping Use: Never used  Substance Use Topics  . Alcohol use: No    Alcohol/week: 0.0 standard drinks  . Drug use: No    Current Outpatient Medications:  .  albuterol (VENTOLIN HFA) 108 (90 Base) MCG/ACT inhaler, Inhale 2 puffs into the lungs every 6 (six) hours as needed for wheezing or shortness of breath., Disp: 6.7 g, Rfl: 0 .  atorvastatin (LIPITOR) 10 MG tablet, Take 1 tablet (10  mg total) by mouth daily., Disp: 90 tablet, Rfl: 1 .  hydrochlorothiazide (HYDRODIURIL) 12.5 MG tablet, TAKE 1 TABLET(12.5 MG) BY MOUTH DAILY, Disp: 90 tablet, Rfl: 1 .  sertraline (ZOLOFT) 50 MG tablet, Take 1 tablet (50 mg total) by mouth daily., Disp: 90 tablet, Rfl: 1 Allergies: Sulfa antibiotics  Review of Systems  Constitutional: Negative for chills, fever and malaise/fatigue.  HENT: Negative for congestion, sinus pain and sore throat.   Eyes: Negative for blurred vision and pain.  Respiratory: Negative for cough and wheezing.   Cardiovascular: Negative for chest pain and leg swelling.  Gastrointestinal: Negative for abdominal pain, constipation, diarrhea, heartburn, nausea and vomiting.  Genitourinary: Negative for dysuria, frequency, hematuria and urgency.  Musculoskeletal: Negative for back pain, joint pain, myalgias and neck pain.  Skin: Negative for itching and rash.  Neurological: Negative for dizziness, tremors and weakness.  Endo/Heme/Allergies: Does not bruise/bleed easily.  Psychiatric/Behavioral: Negative for depression. The patient is not nervous/anxious and does not have insomnia.     Objective: BP 120/70   Ht 5\' 4"  (1.626 m)   Wt 145 lb (65.8 kg)   LMP 11/15/2019   BMI 24.89 kg/m   Filed Weights   11/22/19 0855  Weight: 145 lb (65.8 kg)   Body mass index is 24.89 kg/m. Physical Exam Constitutional:      General: She is  not in acute distress.    Appearance: She is well-developed.  Genitourinary:     Pelvic exam was performed with patient supine.     Vagina, uterus and rectum normal.     No lesions in the vagina.     No vaginal bleeding.     No cervical motion tenderness, friability, lesion or polyp.     Uterus is mobile.     Uterus is not enlarged.     No uterine mass detected.    Uterus is midaxial.     No right or left adnexal mass present.     Right adnexa not tender.     Left adnexa not tender.  HENT:     Head: Normocephalic and atraumatic. No  laceration.     Right Ear: Hearing normal.     Left Ear: Hearing normal.     Mouth/Throat:     Pharynx: Uvula midline.  Eyes:     Pupils: Pupils are equal, round, and reactive to light.  Neck:     Thyroid: No thyromegaly.  Cardiovascular:     Rate and Rhythm: Normal rate and regular rhythm.     Heart sounds: No murmur heard.  No friction rub. No gallop.   Pulmonary:     Effort: Pulmonary effort is normal. No respiratory distress.     Breath sounds: Normal breath sounds. No wheezing.  Chest:     Breasts:        Right: No mass, skin change or tenderness.        Left: No mass, skin change or tenderness.  Abdominal:     General: Bowel sounds are normal. There is no distension.     Palpations: Abdomen is soft.     Tenderness: There is no abdominal tenderness. There is no rebound.  Musculoskeletal:        General: Normal range of motion.     Cervical back: Normal range of motion and neck supple.  Neurological:     Mental Status: She is alert and oriented to person, place, and time.     Cranial Nerves: No cranial nerve deficit.  Skin:    General: Skin is warm and dry.  Psychiatric:        Judgment: Judgment normal.  Vitals reviewed.     Assessment:  ANNUAL EXAM 1. Women's annual routine gynecological examination   2. Screening for cervical cancer   3. Encounter for screening mammogram for malignant neoplasm of breast      Screening Plan:            1.  Cervical Screening-  Pap smear done today  2. Breast screening- Exam annually and mammogram>40 planned   3. Colonoscopy every 10 years, Hemoccult testing - after age 33  4. Labs managed by PCP  5. Counseling for contraception: bilateral tubal ligation    F/U  Return in about 1 year (around 11/21/2020) for Annual.  Barnett Applebaum, MD, [facility], [facility] 11/22/2019  9:23 AM

## 2019-11-22 NOTE — Patient Instructions (Signed)
PAP every three years Mammogram every year    Call [phone] to schedule at [facility] yearly (with PCP)  Thank you for choosing [facility]. As part of our ongoing efforts to improve patient experience, we would appreciate your feedback. Please fill out the short survey that you will receive by mail or MyChart. Your opinion is important to us! - Dr. Derell Bruun   

## 2019-11-25 ENCOUNTER — Other Ambulatory Visit: Payer: Self-pay

## 2019-11-25 LAB — CYTOLOGY - PAP
Comment: NEGATIVE
Diagnosis: NEGATIVE
High risk HPV: NEGATIVE

## 2020-01-09 ENCOUNTER — Other Ambulatory Visit: Payer: Self-pay | Admitting: Family Medicine

## 2020-01-09 DIAGNOSIS — F064 Anxiety disorder due to known physiological condition: Secondary | ICD-10-CM

## 2020-01-09 DIAGNOSIS — E782 Mixed hyperlipidemia: Secondary | ICD-10-CM

## 2020-01-10 ENCOUNTER — Other Ambulatory Visit: Payer: Self-pay

## 2020-01-10 ENCOUNTER — Encounter: Payer: Self-pay | Admitting: Family Medicine

## 2020-01-10 ENCOUNTER — Ambulatory Visit (INDEPENDENT_AMBULATORY_CARE_PROVIDER_SITE_OTHER): Payer: PRIVATE HEALTH INSURANCE | Admitting: Family Medicine

## 2020-01-10 VITALS — BP 130/80 | HR 80 | Ht 64.0 in | Wt 141.0 lb

## 2020-01-10 DIAGNOSIS — E782 Mixed hyperlipidemia: Secondary | ICD-10-CM | POA: Diagnosis not present

## 2020-01-10 DIAGNOSIS — I10 Essential (primary) hypertension: Secondary | ICD-10-CM

## 2020-01-10 DIAGNOSIS — J4521 Mild intermittent asthma with (acute) exacerbation: Secondary | ICD-10-CM

## 2020-01-10 DIAGNOSIS — F064 Anxiety disorder due to known physiological condition: Secondary | ICD-10-CM | POA: Diagnosis not present

## 2020-01-10 MED ORDER — ATORVASTATIN CALCIUM 10 MG PO TABS
ORAL_TABLET | ORAL | 1 refills | Status: DC
Start: 2020-01-10 — End: 2020-07-10

## 2020-01-10 MED ORDER — HYDROCHLOROTHIAZIDE 12.5 MG PO TABS: ORAL_TABLET | ORAL | 1 refills | Status: DC

## 2020-01-10 MED ORDER — SERTRALINE HCL 50 MG PO TABS: 25.0000 mg | ORAL_TABLET | Freq: Every day | ORAL | 1 refills | Status: DC

## 2020-01-10 MED ORDER — ALBUTEROL SULFATE HFA 108 (90 BASE) MCG/ACT IN AERS: 2.0000 | INHALATION_SPRAY | Freq: Four times a day (QID) | RESPIRATORY_TRACT | 0 refills | Status: DC | PRN

## 2020-01-10 NOTE — Progress Notes (Signed)
Date:  01/10/2020   Name:  Denise Clarke   DOB:  [birth year]   MRN:  [id number]   Chief Complaint: Hypertension, Hyperlipidemia, and Depression  Hypertension This is a chronic problem. The current episode started more than 1 year ago. The problem is unchanged. The problem is controlled. Pertinent negatives include no anxiety, blurred vision, chest pain, headaches, malaise/fatigue, neck pain, orthopnea, palpitations, peripheral edema, PND, shortness of breath or sweats. There are no associated agents to hypertension. Past treatments include diuretics. The current treatment provides moderate improvement. There are no compliance problems.  There is no history of angina, kidney disease, CAD/MI, CVA, heart failure, left ventricular hypertrophy, PVD or retinopathy. There is no history of chronic renal disease, a hypertension causing med or renovascular disease.  Hyperlipidemia This is a chronic problem. The current episode started more than 1 year ago. The problem is controlled. Recent lipid tests were reviewed and are normal. She has no history of chronic renal disease, diabetes, hypothyroidism, liver disease, obesity or nephrotic syndrome. Factors aggravating her hyperlipidemia include thiazides. Pertinent negatives include no chest pain, focal sensory loss, focal weakness, leg pain, myalgias or shortness of breath. Current antihyperlipidemic treatment includes statins. The current treatment provides mild improvement of lipids. There are no compliance problems.  Risk factors for coronary artery disease include dyslipidemia and hypertension.  Depression        This is a chronic problem.  The current episode started more than 1 year ago.   The problem occurs intermittently.  The problem has been gradually improving since onset.  Associated symptoms include no decreased concentration, no fatigue, no helplessness, no hopelessness, does not have insomnia, not irritable, no restlessness, no decreased  interest, no appetite change, no body aches, no myalgias, no headaches, no indigestion, not sad and no suicidal ideas.     The symptoms are aggravated by family issues.  Past treatments include SSRIs - Selective serotonin reuptake inhibitors.  Compliance with treatment is good.   Pertinent negatives include no hypothyroidism and no anxiety. Asthma There is no chest tightness, cough, difficulty breathing, frequent throat clearing, hemoptysis, hoarse voice, shortness of breath, sputum production or wheezing. This is a chronic problem. The current episode started more than 1 year ago. The problem occurs daily. The problem has been gradually improving. Pertinent negatives include no appetite change, chest pain, ear congestion, ear pain, fever, headaches, malaise/fatigue, myalgias, nasal congestion, orthopnea, PND, postnasal drip, rhinorrhea, sneezing, sore throat or sweats. Her past medical history is significant for asthma.    Lab Results  Component Value Date   CREATININE 0.87 01/11/2019   BUN 18 01/11/2019   NA 138 01/11/2019   K 4.6 01/11/2019   CL 101 01/11/2019   CO2 25 01/11/2019   Lab Results  Component Value Date   CHOL 185 03/29/2019   HDL 57 03/29/2019   LDLCALC 117 (H) 03/29/2019   TRIG 56 03/29/2019   No results found for: TSH No results found for: HGBA1C Lab Results  Component Value Date   WBC 7.2 05/26/2016   HGB 9.2 (L) 05/26/2016   HCT 27.4 (L) 05/26/2016   MCV 83.1 05/26/2016   PLT 157 05/26/2016   Lab Results  Component Value Date   ALT 19 03/29/2019   AST 18 03/29/2019   ALKPHOS 59 03/29/2019   BILITOT 0.7 03/29/2019     Review of Systems  Constitutional: Negative.  Negative for appetite change, chills, fatigue, fever, malaise/fatigue and unexpected weight change.  HENT: Negative for congestion,  ear discharge, ear pain, hoarse voice, postnasal drip, rhinorrhea, sinus pressure, sneezing and sore throat.   Eyes: Negative for blurred vision, double vision,  photophobia, pain, discharge, redness and itching.  Respiratory: Negative for cough, hemoptysis, sputum production, shortness of breath, wheezing and stridor.   Cardiovascular: Negative for chest pain, palpitations, orthopnea and PND.  Gastrointestinal: Negative for abdominal pain, blood in stool, constipation, diarrhea, nausea and vomiting.  Endocrine: Negative for cold intolerance, heat intolerance, polydipsia, polyphagia and polyuria.  Genitourinary: Negative for dysuria, flank pain, frequency, hematuria, menstrual problem, pelvic pain, urgency, vaginal bleeding and vaginal discharge.  Musculoskeletal: Negative for arthralgias, back pain, myalgias and neck pain.  Skin: Negative for rash.  Allergic/Immunologic: Negative for environmental allergies and food allergies.  Neurological: Negative for dizziness, focal weakness, weakness, light-headedness, numbness and headaches.  Hematological: Negative for adenopathy. Does not bruise/bleed easily.  Psychiatric/Behavioral: Positive for depression. Negative for decreased concentration, dysphoric mood and suicidal ideas. The patient is not nervous/anxious and does not have insomnia.     Patient Active Problem List   Diagnosis Date Noted  . History of cervical dysplasia 11/11/2016  . Hypertension, postpartum condition or complication 99991111  . History of classical cesarean section 05/25/2016  . History of cesarean section, classical 03/11/2016  . Anxiety disorder due to known physiological condition 04/18/2014  . Bacterial upper respiratory infection 04/18/2014    Allergies  Allergen Reactions  . Sulfa Antibiotics Hives    As a child    Past Surgical History:  Procedure Laterality Date  . BUNIONECTOMY    . CESAREAN SECTION  2013  . CESAREAN SECTION WITH BILATERAL TUBAL LIGATION Bilateral 05/25/2016   Procedure: REPEAT CESAREAN SECTION WITH BILATERAL TUBAL LIGATION;  Surgeon: Gae Dry, MD;  Location: [facility] ORS;  Service:  Obstetrics;  Laterality: Bilateral;  Female @ 1550 Apgars: 8/9 Weight: 7lb  2oz  . LAPAROSCOPY  2007    Social History   Tobacco Use  . Smoking status: Never Smoker  . Smokeless tobacco: Never Used  Vaping Use  . Vaping Use: Never used  Substance Use Topics  . Alcohol use: No    Alcohol/week: 0.0 standard drinks  . Drug use: No     Medication list has been reviewed and updated.  Current Meds  Medication Sig  . albuterol (VENTOLIN HFA) 108 (90 Base) MCG/ACT inhaler Inhale 2 puffs into the lungs every 6 (six) hours as needed for wheezing or shortness of breath.  Marland Kitchen atorvastatin (LIPITOR) 10 MG tablet TAKE 1 TABLET(10 MG) BY MOUTH DAILY  . hydrochlorothiazide (HYDRODIURIL) 12.5 MG tablet TAKE 1 TABLET(12.5 MG) BY MOUTH DAILY  . sertraline (ZOLOFT) 50 MG tablet TAKE 1 TABLET(50 MG) BY MOUTH DAILY (Patient taking differently: 25 mg.)    PHQ 2/9 Scores 01/10/2020 07/16/2019 06/28/2019 02/22/2019  PHQ - 2 Score 0 0 0 0  PHQ- 9 Score 0 0 0 0    GAD 7 : Generalized Anxiety Score 01/10/2020 07/16/2019 06/28/2019 02/22/2019  Nervous, Anxious, on Edge 0 0 0 0  Control/stop worrying 0 0 0 0  Worry too much - different things 0 0 0 0  Trouble relaxing 0 0 0 0  Restless 0 0 0 0  Easily annoyed or irritable 0 0 0 0  Afraid - awful might happen 0 0 0 0  Total GAD 7 Score 0 0 0 0  Anxiety Difficulty - - Not difficult at all -    BP Readings from Last 3 Encounters:  01/10/20 130/80  11/22/19 120/70  07/16/19 120/60    Physical Exam Vitals and nursing note reviewed.  Constitutional:      General: She is not irritable.    Appearance: She is well-developed and well-nourished.  HENT:     Head: Normocephalic.     Right Ear: Tympanic membrane, ear canal and external ear normal.     Left Ear: Tympanic membrane, ear canal and external ear normal.     Nose: Nose normal.     Mouth/Throat:     Mouth: Oropharynx is clear and moist. Mucous membranes are moist.  Eyes:     General: Lids are  everted, no foreign bodies appreciated. No scleral icterus.       Left eye: No foreign body or hordeolum.     Extraocular Movements: EOM normal.     Conjunctiva/sclera: Conjunctivae normal.     Right eye: Right conjunctiva is not injected.     Left eye: Left conjunctiva is not injected.     Pupils: Pupils are equal, round, and reactive to light.  Neck:     Thyroid: No thyromegaly.     Vascular: No JVD.     Trachea: No tracheal deviation.  Cardiovascular:     Rate and Rhythm: Normal rate and regular rhythm.     Pulses: Intact distal pulses.     Heart sounds: Normal heart sounds. No murmur heard. No friction rub. No gallop.   Pulmonary:     Effort: Pulmonary effort is normal. No respiratory distress.     Breath sounds: Normal breath sounds. No wheezing, rhonchi or rales.  Abdominal:     General: Bowel sounds are normal.     Palpations: Abdomen is soft. There is no hepatosplenomegaly or mass.     Tenderness: There is no abdominal tenderness. There is no guarding or rebound.  Musculoskeletal:        General: No tenderness or edema. Normal range of motion.     Cervical back: Normal range of motion and neck supple.  Lymphadenopathy:     Cervical: No cervical adenopathy.  Skin:    General: Skin is warm.     Capillary Refill: Capillary refill takes less than 2 seconds.     Coloration: Skin is not pale.     Findings: No rash.  Neurological:     Mental Status: She is alert and oriented to person, place, and time.     Cranial Nerves: No cranial nerve deficit.     Deep Tendon Reflexes: Strength normal. Reflexes normal.  Psychiatric:        Mood and Affect: Mood and affect normal. Mood is not anxious or depressed.     Wt Readings from Last 3 Encounters:  01/10/20 141 lb (64 kg)  11/22/19 145 lb (65.8 kg)  07/16/19 133 lb (60.3 kg)    BP 130/80   Pulse 80   Ht 5\' 4"  (1.626 m)   Wt 141 lb (64 kg)   LMP 01/04/2020 (Exact Date)   BMI 24.20 kg/m   Assessment and Plan: 1.  Anxiety disorder due to known physiological condition Chronic.  Controlled.  Stable.  PHQ 0 gad score 0.  We will continue sertraline 50 mg 1/2 tablet 25 mg once a day. - sertraline (ZOLOFT) 50 MG tablet; Take 0.5 tablets (25 mg total) by mouth daily.  Dispense: 90 tablet; Refill: 1  2. Essential hypertension Chronic.  Controlled.  Stable.  Blood pressure today is 130/80.  We will continue hydrochlorothiazide 12.5 mg once a day.  Will check CMP  for electrolytes, GFR and hepatic function. - hydrochlorothiazide (HYDRODIURIL) 12.5 MG tablet; TAKE 1 TABLET(12.5 MG) BY MOUTH DAILY  Dispense: 90 tablet; Refill: 1 - Comprehensive metabolic panel  3. Mild intermittent reactive airway disease with acute exacerbation Chronic.  Controlled.  Stable.  Continue albuterol inhaler 1 to 2 puffs every 6 hours as needed for wheezing. - albuterol (VENTOLIN HFA) 108 (90 Base) MCG/ACT inhaler; Inhale 2 puffs into the lungs every 6 (six) hours as needed for wheezing or shortness of breath.  Dispense: 6.7 g; Refill: 0  4. Mixed hyperlipidemia Chronic.  Controlled.  Stable.  Continue atorvastatin 10 mg once a day.  Will check lipid panel for current LDL status. - atorvastatin (LIPITOR) 10 MG tablet; TAKE 1 TABLET(10 MG) BY MOUTH DAILY  Dispense: 90 tablet; Refill: 1 - Lipid Panel With LDL/HDL Ratio

## 2020-01-11 LAB — COMPREHENSIVE METABOLIC PANEL
ALT: 20 IU/L (ref 0–32)
AST: 16 IU/L (ref 0–40)
Albumin/Globulin Ratio: 2.1 (ref 1.2–2.2)
Albumin: 4.8 g/dL (ref 3.8–4.8)
Alkaline Phosphatase: 58 IU/L (ref 44–121)
BUN/Creatinine Ratio: 17 (ref 9–23)
BUN: 15 mg/dL (ref 6–24)
Bilirubin Total: 0.8 mg/dL (ref 0.0–1.2)
CO2: 27 mmol/L (ref 20–29)
Calcium: 9.5 mg/dL (ref 8.7–10.2)
Chloride: 98 mmol/L (ref 96–106)
Creatinine, Ser: 0.89 mg/dL (ref 0.57–1.00)
GFR calc Af Amer: 94 mL/min/{1.73_m2} (ref >59)
GFR calc non Af Amer: 81 mL/min/{1.73_m2} (ref >59)
Globulin, Total: 2.3 g/dL (ref 1.5–4.5)
Glucose: 91 mg/dL (ref 65–99)
Potassium: 3.9 mmol/L (ref 3.5–5.2)
Sodium: 140 mmol/L (ref 134–144)
Total Protein: 7.1 g/dL (ref 6.0–8.5)

## 2020-01-11 LAB — LIPID PANEL WITH LDL/HDL RATIO
Cholesterol, Total: 174 mg/dL (ref 100–199)
HDL: 58 mg/dL (ref >39)
LDL Chol Calc (NIH): 104 mg/dL — ABNORMAL HIGH (ref 0–99)
LDL/HDL Ratio: 1.8 ratio (ref 0.0–3.2)
Triglycerides: 61 mg/dL (ref 0–149)
VLDL Cholesterol Cal: 12 mg/dL (ref 5–40)

## 2020-02-04 ENCOUNTER — Other Ambulatory Visit: Payer: Self-pay

## 2020-02-04 ENCOUNTER — Ambulatory Visit
Admission: RE | Admit: 2020-02-04 | Discharge: 2020-02-04 | Disposition: A | Discharge status: Home / Self Care | Payer: PRIVATE HEALTH INSURANCE | Source: Ambulatory Visit | Attending: Obstetrics & Gynecology | Admitting: Obstetrics & Gynecology

## 2020-02-04 DIAGNOSIS — Z1231 Encounter for screening mammogram for malignant neoplasm of breast: Secondary | ICD-10-CM | POA: Insufficient documentation

## 2020-02-13 ENCOUNTER — Other Ambulatory Visit: Payer: Self-pay | Admitting: Obstetrics & Gynecology

## 2020-02-13 DIAGNOSIS — N6310 Unspecified lump in the right breast, unspecified quadrant: Secondary | ICD-10-CM

## 2020-02-13 DIAGNOSIS — R928 Other abnormal and inconclusive findings on diagnostic imaging of breast: Secondary | ICD-10-CM

## 2020-02-21 ENCOUNTER — Ambulatory Visit
Admission: RE | Admit: 2020-02-21 | Discharge: 2020-02-21 | Disposition: A | Discharge status: Home / Self Care | Payer: PRIVATE HEALTH INSURANCE | Source: Ambulatory Visit | Attending: Obstetrics & Gynecology | Admitting: Obstetrics & Gynecology

## 2020-02-21 ENCOUNTER — Other Ambulatory Visit: Payer: Self-pay

## 2020-02-21 DIAGNOSIS — R928 Other abnormal and inconclusive findings on diagnostic imaging of breast: Secondary | ICD-10-CM | POA: Insufficient documentation

## 2020-02-24 ENCOUNTER — Other Ambulatory Visit: Payer: Self-pay | Admitting: Obstetrics & Gynecology

## 2020-02-24 DIAGNOSIS — R928 Other abnormal and inconclusive findings on diagnostic imaging of breast: Secondary | ICD-10-CM

## 2020-02-24 DIAGNOSIS — N6310 Unspecified lump in the right breast, unspecified quadrant: Secondary | ICD-10-CM

## 2020-02-24 NOTE — Progress Notes (Signed)
Referral to Dr Donne Hazel in [place] for abnormal right mammogram mass, urgent if possible (Dr Kenton Kingfisher requesting Dr Donne Hazel personally)  I have called and counseled pt as to MMG and [place] results of right sided breast mass 1.6 cm, and need for follow up and biopsy.  We have decided for this to be done thru Dr Donne Hazel, not thru [facility] (as far as biopsy is concerned).

## 2020-03-06 ENCOUNTER — Other Ambulatory Visit: Payer: Self-pay | Admitting: General Surgery

## 2020-03-06 DIAGNOSIS — N63 Unspecified lump in unspecified breast: Secondary | ICD-10-CM

## 2020-03-13 ENCOUNTER — Ambulatory Visit
Admission: RE | Admit: 2020-03-13 | Discharge: 2020-03-13 | Disposition: A | Discharge status: Home / Self Care | Payer: PRIVATE HEALTH INSURANCE | Source: Ambulatory Visit | Attending: General Surgery | Admitting: General Surgery

## 2020-03-13 ENCOUNTER — Other Ambulatory Visit: Payer: Self-pay

## 2020-03-13 DIAGNOSIS — N63 Unspecified lump in unspecified breast: Secondary | ICD-10-CM

## 2020-03-19 ENCOUNTER — Other Ambulatory Visit: Payer: Self-pay | Admitting: General Surgery

## 2020-03-19 DIAGNOSIS — N6489 Other specified disorders of breast: Secondary | ICD-10-CM

## 2020-03-23 ENCOUNTER — Other Ambulatory Visit: Payer: Self-pay | Admitting: General Surgery

## 2020-03-23 DIAGNOSIS — N6489 Other specified disorders of breast: Secondary | ICD-10-CM

## 2020-04-06 NOTE — Progress Notes (Signed)
Surgical Instructions    Your procedure is scheduled on Friday 04/10/2020.  Report to [facility] Main Entrance "A" at 0530 A.M., then check in with the Admitting office.   Call this number if you have problems the morning of surgery:  [phone]    If you have any questions prior to your surgery date call [phone]: Open Monday-Friday 8am-4pm    Remember:  Do not eat after midnight the night before your surgery  You may drink clear liquids until 04:30 the morning of your surgery.   Clear liquids allowed are: Water, Non-Citrus Juices (without pulp), Carbonated Beverages, Clear Tea, Black Coffee Only, and Gatorade  Please complete your PRE-SURGERY ENSURE that was provided to you by 4:30am the morning of surgery.  Please, if able, drink it in one sitting. DO NOT SIP.     Take these medicines the morning of surgery with A SIP OF WATER: Atorvastatin (Lipitor) Sertraline (Zoloft)  If needed: Albuterol (Ventolin) inhaler - Please bring all inhalers with you the day of surgery.  Cetirizine (Zyrtec)  As of today, STOP taking any Aspirin or aspirin-containing products (unless otherwise instructed by your surgeon), Aleve, Naproxen, Ibuprofen, Motrin, Advil, Goody's, BC's, all herbal medications, supplements, fish oil, and all vitamins.          Do NOT Smoke (Tobacco/Vaping) or drink Alcohol 24 hours prior to your procedure  If you use a CPAP at night, you may bring all equipment for your overnight stay.   Contacts, glasses, hearing aids, dentures or partials may not be worn into surgery, please bring cases for these belongings   For patients admitted to the hospital, discharge time will be determined by your treatment team.   Patients discharged the day of surgery will not be allowed to drive home, and someone needs to stay with them for 24 hours.    Special instructions:   [facility]- Preparing For Surgery  Before surgery, you can play an important role. Because skin is not  sterile, your skin needs to be as free of germs as possible. You can reduce the number of germs on your skin by washing with CHG (chlorahexidine gluconate) Soap before surgery.  CHG is an antiseptic cleaner which kills germs and bonds with the skin to continue killing germs even after washing.    Oral Hygiene is also important to reduce your risk of infection.  Remember - BRUSH YOUR TEETH THE MORNING OF SURGERY WITH YOUR REGULAR TOOTHPASTE  Please do not use if you have an allergy to CHG or antibacterial soaps. If your skin becomes reddened/irritated stop using the CHG.  Do not shave (including legs and underarms) for at least 48 hours prior to first CHG shower. It is OK to shave your face.  Please follow these instructions carefully.   You are going to shower with CHG soap 2 different times.  The NIGHT BEFORE SURGERY/PROCEDURE and then again the MORNING OF SURGERY/PROCEDURE   1. If you chose to wash your hair, wash your hair first as usual with your normal shampoo.  2. After you shampoo, rinse your hair and body thoroughly to remove the shampoo.  3. Wash Face and genitals (private parts) with your normal soap.   4. THEN Shower with CHG Soap.   5. Use CHG as you would any other liquid soap. You can apply CHG directly to the skin and wash gently with a pouf/sponge or a clean washcloth.   6. Apply the CHG Soap to your body ONLY FROM THE NECK  DOWN.  Do not use on open wounds or open sores. Avoid contact with your eyes, ears, mouth and genitals (private parts). Wash Face and genitals (private parts)  with your normal soap.   7. Wash thoroughly, paying special attention to the area where your surgery will be performed.  8. Thoroughly rinse your body with warm water from the neck down.  9. DO NOT shower/wash with your normal soap after using and rinsing off the CHG Soap.  10. Pat yourself dry with a CLEAN TOWEL.  11. Wear CLEAN PAJAMAS to bed the night before surgery  12. Place CLEAN  SHEETS on your bed the night before your surgery  13. DO NOT SLEEP WITH PETS.   Day of Surgery: Shower with CHG soap as directed.  Do not shave 48 hours prior to surgery.  Men may shave face and neck.  Do not wear lotions, powders, perfumes/colognes, or deodorant.  Wear Clean/Comfortable clothing the morning of surgery  Remember to brush your teeth WITH YOUR REGULAR TOOTHPASTE.             Do not wear jewelry, make up, or nail polish  Do not bring valuables to the hospital.             [facility] is not responsible for any belongings or valuables.    Please read over the following fact sheets that you were given.

## 2020-04-07 ENCOUNTER — Other Ambulatory Visit: Payer: Self-pay

## 2020-04-07 ENCOUNTER — Encounter (HOSPITAL_COMMUNITY)
Admission: RE | Admit: 2020-04-07 | Discharge: 2020-04-07 | Disposition: A | Discharge status: Home / Self Care | Payer: PRIVATE HEALTH INSURANCE | Source: Ambulatory Visit | Attending: General Surgery | Admitting: General Surgery

## 2020-04-07 ENCOUNTER — Encounter (HOSPITAL_COMMUNITY): Payer: Self-pay

## 2020-04-07 DIAGNOSIS — Z01818 Encounter for other preprocedural examination: Secondary | ICD-10-CM | POA: Insufficient documentation

## 2020-04-07 DIAGNOSIS — Z20822 Contact with and (suspected) exposure to covid-19: Secondary | ICD-10-CM | POA: Insufficient documentation

## 2020-04-07 HISTORY — DX: Essential (primary) hypertension: I10

## 2020-04-07 HISTORY — DX: Other complications of anesthesia, initial encounter: T88.59XA

## 2020-04-07 HISTORY — DX: Other specified postprocedural states: Z98.890

## 2020-04-07 HISTORY — DX: Anxiety disorder, unspecified: F41.9

## 2020-04-07 HISTORY — DX: Nausea with vomiting, unspecified: R11.2

## 2020-04-07 LAB — BASIC METABOLIC PANEL
Anion gap: 7 (ref 5–15)
BUN: 12 mg/dL (ref 6–20)
CO2: 30 mmol/L (ref 22–32)
Calcium: 9.5 mg/dL (ref 8.9–10.3)
Chloride: 102 mmol/L (ref 98–111)
Creatinine, Ser: 0.85 mg/dL (ref 0.44–1.00)
GFR, Estimated: >60 mL/min (ref >60)
Glucose, Bld: 102 mg/dL — ABNORMAL HIGH (ref 70–99)
Potassium: 3.7 mmol/L (ref 3.5–5.1)
Sodium: 139 mmol/L (ref 135–145)

## 2020-04-07 LAB — CBC
HCT: 47.5 % — ABNORMAL HIGH (ref 36.0–46.0)
Hemoglobin: 15.9 g/dL — ABNORMAL HIGH (ref 12.0–15.0)
MCH: 30.4 pg (ref 26.0–34.0)
MCHC: 33.5 g/dL (ref 30.0–36.0)
MCV: 90.8 fL (ref 80.0–100.0)
Platelets: 191 10*3/uL (ref 150–400)
RBC: 5.23 MIL/uL — ABNORMAL HIGH (ref 3.87–5.11)
RDW: 12.3 % (ref 11.5–15.5)
WBC: 4.5 10*3/uL (ref 4.0–10.5)
nRBC: 0 % (ref 0.0–0.2)

## 2020-04-07 LAB — POCT PREGNANCY, URINE: Preg Test, Ur: NEGATIVE

## 2020-04-07 LAB — SARS CORONAVIRUS 2 (TAT 6-24 HRS): SARS Coronavirus 2: NEGATIVE

## 2020-04-07 NOTE — Progress Notes (Signed)
PCP - Dr. Otilio Miu Cardiologist - denies  Chest x-ray - n/a EKG - 04/07/20 Stress Test - denies  ECHO - denies Cardiac Cath - denies  Sleep Study - denies CPAP - denies  Blood Thinner Instructions: n/a Aspirin Instructions: n/a  ERAS Protcol - Pt to stop clear liquids by 0430 DOS. PRE-SURGERY Ensure or G2- Pt provided with (1) pre-surgical ensure to complete by 0430.  POCT urine preg completed prior to seed placement scheduled for 04/09/20.  COVID TEST- 04/07/20; pt aware to quarantine until procedure.    Anesthesia review:   Patient denies shortness of breath, fever, cough and chest pain at PAT appointment   All instructions explained to the patient, with a verbal understanding of the material. Patient agrees to go over the instructions while at home for a better understanding. Patient also instructed to self quarantine after being tested for COVID-19. The opportunity to ask questions was provided.

## 2020-04-08 ENCOUNTER — Other Ambulatory Visit: Admission: RE | Admit: 2020-04-08 | Payer: PRIVATE HEALTH INSURANCE | Source: Ambulatory Visit

## 2020-04-08 NOTE — Anesthesia Preprocedure Evaluation (Addendum)
Anesthesia Evaluation  Patient identified by MRN, date of birth, ID band Patient awake    Reviewed: Allergy & Precautions, NPO status , Patient's Chart, lab work & pertinent test results  History of Anesthesia Complications (+) PONV and history of anesthetic complications  Airway Mallampati: II  TM Distance: >3 FB Neck ROM: Full    Dental no notable dental hx. (+) Teeth Intact, Dental Advisory Given   Pulmonary neg pulmonary ROS,    Pulmonary exam normal breath sounds clear to auscultation       Cardiovascular hypertension, Pt. on medications Normal cardiovascular exam Rhythm:Regular Rate:Normal     Neuro/Psych  Headaches, PSYCHIATRIC DISORDERS Anxiety    GI/Hepatic Neg liver ROS, GERD  ,  Endo/Other  negative endocrine ROS  Renal/GU negative Renal ROS     Musculoskeletal negative musculoskeletal ROS (+)   Abdominal   Peds  Hematology Lab Results      Component                Value               Date                      WBC                      4.5                 04/07/2020                HGB                      15.9 (H)            04/07/2020                HCT                      47.5 (H)            04/07/2020                MCV                      90.8                04/07/2020                PLT                      191                 04/07/2020              Anesthesia Other Findings   Reproductive/Obstetrics                           Anesthesia Physical Anesthesia Plan  ASA: II  Anesthesia Plan: General   Post-op Pain Management:    Induction: Intravenous  PONV Risk Score and Plan: 4 or greater and Treatment may vary due to age or medical condition, Midazolam, Dexamethasone, Ondansetron and Aprepitant  Airway Management Planned: LMA  Additional Equipment: None  Intra-op Plan:   Post-operative Plan:   Informed Consent: I have reviewed the patients History and  Physical, chart, labs and discussed the procedure including the risks, benefits and alternatives for the proposed anesthesia with the patient  or authorized representative who has indicated his/her understanding and acceptance.     Dental advisory given  Plan Discussed with: CRNA and Anesthesiologist  Anesthesia Plan Comments: (PAT note written 04/08/2020 by Myra Gianotti, PA-C.  [place]  )      Anesthesia Quick Evaluation

## 2020-04-08 NOTE — Progress Notes (Signed)
Anesthesia Chart Review:  Case: [id number] Date/Time: 04/10/20 0715   Procedure: RADIOACTIVE SEED GUIDED EXCISIONAL RIGHT BREAST BIOPSY (Right Breast)   Anesthesia type: General   Pre-op diagnosis: RIGHT BREAST MASS   Location: [facility] ROOM 09 / [place] [facility]   Surgeons: Rolm Bookbinder, MD      DISCUSSION: Patient is a 40 year old female scheduled for the above procedure. Above procedure recommended after finding of suspicious right breast mass on recent breast imaging.  History includes never smoker, post-operative N/V, HTN (with history of preeclampsia), reflux, asthma, anxiety, migraines, right breast mass (02/2020).  Urine pregnancy test negative on 04/07/20. 04/07/20 presurgical COVID-19 test negative. RSL scheduled for 04/09/20. Anesthesia team to evaluate on the day of surgery.   VS: BP (!) 132/96   Pulse 73   Temp 36.8 C (Oral)   Resp 18   Ht 5\' 4"  (1.626 m)   Wt 65.2 kg   LMP 03/17/2020 (Exact Date)   SpO2 99%   BMI 24.67 kg/m     PROVIDERS: Juline Patch, MD is PCP  Teresa Coombs, MD is GYN   LABS: Labs reviewed: Acceptable for surgery. (all labs ordered are listed, but only abnormal results are displayed)  Labs Reviewed  BASIC METABOLIC PANEL - Abnormal; Notable for the following components:      Result Value   Glucose, Bld 102 (*)    All other components within normal limits  CBC - Abnormal; Notable for the following components:   RBC 5.23 (*)    Hemoglobin 15.9 (*)    HCT 47.5 (*)    All other components within normal limits  SARS CORONAVIRUS 2 (TAT 6-24 HRS)  POCT PREGNANCY, URINE     IMAGES: Digital diagnostic right mammogram with TOMO/limited US 02/21/20: IMPRESSION: 1. Suspicious 1.6 cm mass with distortion in the UPPER-OUTER RIGHT breast. Tissue sampling is recommended. 2. No abnormal appearing RIGHT axillary lymph nodes. RECOMMENDATION: Ultrasound-guided RIGHT breast biopsy, which will be scheduled.   EKG: 04/07/20: Normal sinus rhythm Incomplete  right bundle branch block Borderline ECG No old tracing to compare Confirmed by [place], Peter [phone]) on 04/07/2020 4:50:53 PM   CV: N/A  Past Medical History:  Diagnosis Date  . AMA (advanced maternal age) multigravida 56+   . Anxiety   . Complication of anesthesia   . Esophageal reflux   . Headache    migraine  . Hx of pre-eclampsia in prior pregnancy, currently pregnant   . Hypertension   . PONV (postoperative nausea and vomiting)    spinal anesthesia with C-section x2.   . Type O blood, Rh negative     Past Surgical History:  Procedure Laterality Date  . BUNIONECTOMY    . CESAREAN SECTION  2013  . CESAREAN SECTION WITH BILATERAL TUBAL LIGATION Bilateral 05/25/2016   Procedure: REPEAT CESAREAN SECTION WITH BILATERAL TUBAL LIGATION;  Surgeon: Gae Dry, MD;  Location: [facility] ORS;  Service: Obstetrics;  Laterality: Bilateral;  Female @ 1550 Apgars: 8/9 Weight: 7lb  2oz  . LAPAROSCOPY  2007    MEDICATIONS: . albuterol (VENTOLIN HFA) 108 (90 Base) MCG/ACT inhaler  . atorvastatin (LIPITOR) 10 MG tablet  . cetirizine (ZYRTEC) 10 MG tablet  . hydrochlorothiazide (HYDRODIURIL) 12.5 MG tablet  . sertraline (ZOLOFT) 50 MG tablet   No current facility-administered medications for this encounter.    Myra Gianotti, PA-C Surgical Short Stay/Anesthesiology [facility] Phone [phone] [facility] Phone [phone] 04/08/2020 9:20 AM

## 2020-04-09 ENCOUNTER — Other Ambulatory Visit: Payer: Self-pay

## 2020-04-09 ENCOUNTER — Telehealth: Payer: Self-pay

## 2020-04-09 ENCOUNTER — Other Ambulatory Visit: Payer: Self-pay | Admitting: General Surgery

## 2020-04-09 ENCOUNTER — Ambulatory Visit
Admission: RE | Admit: 2020-04-09 | Discharge: 2020-04-09 | Disposition: A | Discharge status: Home / Self Care | Payer: PRIVATE HEALTH INSURANCE | Source: Ambulatory Visit | Attending: General Surgery | Admitting: General Surgery

## 2020-04-09 DIAGNOSIS — N6489 Other specified disorders of breast: Secondary | ICD-10-CM

## 2020-04-09 NOTE — Telephone Encounter (Signed)
Called back with overview of labs- all were acceptable

## 2020-04-09 NOTE — Telephone Encounter (Signed)
Copied from [place] [phone]. Topic: General - Other >> Apr 09, 2020 12:23 PM Virl Axe D wrote: Reason for CRM: Pt has a surgery coming up tomorrow. She would like Dr Ronnald Ramp to take a look at some recent labs. Requesting return call from [place] today.

## 2020-04-10 ENCOUNTER — Encounter (HOSPITAL_COMMUNITY)
Admission: RE | Disposition: A | Discharge status: Home / Self Care | Payer: Self-pay | Source: Home / Self Care | Attending: General Surgery

## 2020-04-10 ENCOUNTER — Ambulatory Visit (HOSPITAL_COMMUNITY)
Admission: RE | Admit: 2020-04-10 | Discharge: 2020-04-10 | Disposition: A | Discharge status: Home / Self Care | Payer: PRIVATE HEALTH INSURANCE | Attending: General Surgery | Admitting: General Surgery

## 2020-04-10 ENCOUNTER — Ambulatory Visit (HOSPITAL_COMMUNITY): Payer: PRIVATE HEALTH INSURANCE | Admitting: Certified Registered Nurse Anesthetist

## 2020-04-10 ENCOUNTER — Ambulatory Visit
Admission: RE | Admit: 2020-04-10 | Discharge: 2020-04-10 | Disposition: A | Discharge status: Home / Self Care | Payer: PRIVATE HEALTH INSURANCE | Source: Ambulatory Visit | Attending: General Surgery | Admitting: General Surgery

## 2020-04-10 ENCOUNTER — Encounter (HOSPITAL_COMMUNITY): Payer: Self-pay | Admitting: General Surgery

## 2020-04-10 ENCOUNTER — Ambulatory Visit (HOSPITAL_COMMUNITY): Payer: PRIVATE HEALTH INSURANCE | Admitting: Vascular Surgery

## 2020-04-10 DIAGNOSIS — N6021 Fibroadenosis of right breast: Secondary | ICD-10-CM | POA: Diagnosis not present

## 2020-04-10 DIAGNOSIS — Z882 Allergy status to sulfonamides status: Secondary | ICD-10-CM | POA: Diagnosis not present

## 2020-04-10 DIAGNOSIS — D241 Benign neoplasm of right breast: Secondary | ICD-10-CM | POA: Diagnosis present

## 2020-04-10 DIAGNOSIS — Z79899 Other long term (current) drug therapy: Secondary | ICD-10-CM | POA: Insufficient documentation

## 2020-04-10 DIAGNOSIS — N6489 Other specified disorders of breast: Secondary | ICD-10-CM

## 2020-04-10 DIAGNOSIS — N62 Hypertrophy of breast: Secondary | ICD-10-CM | POA: Insufficient documentation

## 2020-04-10 HISTORY — PX: RADIOACTIVE SEED GUIDED EXCISIONAL BREAST BIOPSY: SHX6490

## 2020-04-10 SURGERY — RADIOACTIVE SEED GUIDED BREAST BIOPSY
Anesthesia: General | Site: Breast | Laterality: Right

## 2020-04-10 MED ORDER — EPHEDRINE 5 MG/ML INJ
INTRAVENOUS | Status: AC
  Filled 2020-04-10: qty 10

## 2020-04-10 MED ORDER — ONDANSETRON HCL 4 MG/2ML IJ SOLN
INTRAMUSCULAR | Status: DC | PRN
  Administered 2020-04-10: 4 mg via INTRAVENOUS

## 2020-04-10 MED ORDER — MIDAZOLAM HCL 2 MG/2ML IJ SOLN
INTRAMUSCULAR | Status: AC
  Filled 2020-04-10: qty 2

## 2020-04-10 MED ORDER — BUPIVACAINE HCL 0.25 % IJ SOLN
INTRAMUSCULAR | Status: DC | PRN
  Administered 2020-04-10: 10 mL

## 2020-04-10 MED ORDER — PHENYLEPHRINE 40 MCG/ML (10ML) SYRINGE FOR IV PUSH (FOR BLOOD PRESSURE SUPPORT)
PREFILLED_SYRINGE | INTRAVENOUS | Status: DC | PRN
  Administered 2020-04-10: 80 ug via INTRAVENOUS

## 2020-04-10 MED ORDER — MIDAZOLAM HCL 2 MG/2ML IJ SOLN
INTRAMUSCULAR | Status: DC | PRN
  Administered 2020-04-10: 2 mg via INTRAVENOUS

## 2020-04-10 MED ORDER — CEFAZOLIN SODIUM-DEXTROSE 2-4 GM/100ML-% IV SOLN
2.0000 g | INTRAVENOUS | Status: AC
  Administered 2020-04-10: 2 g via INTRAVENOUS
  Filled 2020-04-10: qty 100

## 2020-04-10 MED ORDER — 0.9 % SODIUM CHLORIDE (POUR BTL) OPTIME
TOPICAL | Status: DC | PRN
  Administered 2020-04-10: 1000 mL

## 2020-04-10 MED ORDER — ENSURE PRE-SURGERY PO LIQD: 296.0000 mL | Freq: Once | ORAL | Status: DC

## 2020-04-10 MED ORDER — DEXMEDETOMIDINE (PRECEDEX) IN NS 20 MCG/5ML (4 MCG/ML) IV SYRINGE
PREFILLED_SYRINGE | INTRAVENOUS | Status: DC | PRN
  Administered 2020-04-10: 12 ug via INTRAVENOUS

## 2020-04-10 MED ORDER — PROPOFOL 10 MG/ML IV BOLUS
INTRAVENOUS | Status: DC | PRN
  Administered 2020-04-10: 50 mg via INTRAVENOUS
  Administered 2020-04-10: 100 mg via INTRAVENOUS

## 2020-04-10 MED ORDER — DEXAMETHASONE SODIUM PHOSPHATE 10 MG/ML IJ SOLN
INTRAMUSCULAR | Status: AC
  Filled 2020-04-10: qty 1

## 2020-04-10 MED ORDER — PROPOFOL 10 MG/ML IV BOLUS
INTRAVENOUS | Status: AC
  Filled 2020-04-10: qty 20

## 2020-04-10 MED ORDER — BUPIVACAINE HCL (PF) 0.25 % IJ SOLN
INTRAMUSCULAR | Status: AC
  Filled 2020-04-10: qty 30

## 2020-04-10 MED ORDER — ONDANSETRON HCL 4 MG/2ML IJ SOLN
INTRAMUSCULAR | Status: AC
  Filled 2020-04-10: qty 2

## 2020-04-10 MED ORDER — FENTANYL CITRATE (PF) 250 MCG/5ML IJ SOLN
INTRAMUSCULAR | Status: DC | PRN
  Administered 2020-04-10 (×2): 25 ug via INTRAVENOUS

## 2020-04-10 MED ORDER — CHLORHEXIDINE GLUCONATE 0.12 % MT SOLN
15.0000 mL | Freq: Once | OROMUCOSAL | Status: AC
  Administered 2020-04-10: 15 mL via OROMUCOSAL
  Filled 2020-04-10: qty 15

## 2020-04-10 MED ORDER — KETOROLAC TROMETHAMINE 15 MG/ML IJ SOLN
15.0000 mg | INTRAMUSCULAR | Status: AC
  Administered 2020-04-10: 15 mg via INTRAVENOUS
  Filled 2020-04-10: qty 1

## 2020-04-10 MED ORDER — ORAL CARE MOUTH RINSE: 15.0000 mL | Freq: Once | OROMUCOSAL | Status: AC

## 2020-04-10 MED ORDER — EPHEDRINE SULFATE-NACL 50-0.9 MG/10ML-% IV SOSY
PREFILLED_SYRINGE | INTRAVENOUS | Status: DC | PRN
  Administered 2020-04-10 (×2): 5 mg via INTRAVENOUS

## 2020-04-10 MED ORDER — DEXAMETHASONE SODIUM PHOSPHATE 10 MG/ML IJ SOLN
INTRAMUSCULAR | Status: DC | PRN
  Administered 2020-04-10: 5 mg via INTRAVENOUS

## 2020-04-10 MED ORDER — BSS IO SOLN
INTRAOCULAR | Status: AC
  Filled 2020-04-10: qty 15

## 2020-04-10 MED ORDER — KETOROLAC TROMETHAMINE 30 MG/ML IJ SOLN
INTRAMUSCULAR | Status: AC
  Filled 2020-04-10: qty 1

## 2020-04-10 MED ORDER — FENTANYL CITRATE (PF) 250 MCG/5ML IJ SOLN
INTRAMUSCULAR | Status: AC
  Filled 2020-04-10: qty 5

## 2020-04-10 MED ORDER — TRAMADOL HCL 50 MG PO TABS: 50.0000 mg | ORAL_TABLET | Freq: Four times a day (QID) | ORAL | 0 refills | Status: DC | PRN

## 2020-04-10 MED ORDER — LACTATED RINGERS IV SOLN: INTRAVENOUS | Status: DC

## 2020-04-10 MED ORDER — APREPITANT 40 MG PO CAPS
40.0000 mg | ORAL_CAPSULE | ORAL | Status: AC
  Administered 2020-04-10: 40 mg via ORAL
  Filled 2020-04-10: qty 1

## 2020-04-10 MED ORDER — ACETAMINOPHEN 500 MG PO TABS
1000.0000 mg | ORAL_TABLET | ORAL | Status: AC
  Administered 2020-04-10: 1000 mg via ORAL
  Filled 2020-04-10: qty 2

## 2020-04-10 SURGICAL SUPPLY — 40 items
APPLIER CLIP 9.375 MED OPEN (MISCELLANEOUS)
BINDER BREAST LRG (GAUZE/BANDAGES/DRESSINGS) IMPLANT
BINDER BREAST XLRG (GAUZE/BANDAGES/DRESSINGS) ×2 IMPLANT
CANISTER SUCT 3000ML PPV (MISCELLANEOUS) ×2 IMPLANT
CHLORAPREP W/TINT 26 (MISCELLANEOUS) ×2 IMPLANT
CLIP APPLIE 9.375 MED OPEN (MISCELLANEOUS) IMPLANT
CLIP VESOCCLUDE MED 6/CT (CLIP) ×2 IMPLANT
CLOSURE STERI-STRIP 1/4X4 (GAUZE/BANDAGES/DRESSINGS) ×2 IMPLANT
COVER PROBE W GEL 5X96 (DRAPES) ×2 IMPLANT
COVER SURGICAL LIGHT HANDLE (MISCELLANEOUS) ×2 IMPLANT
COVER WAND RF STERILE (DRAPES) ×2 IMPLANT
DERMABOND ADVANCED (GAUZE/BANDAGES/DRESSINGS) ×1
DERMABOND ADVANCED .7 DNX12 (GAUZE/BANDAGES/DRESSINGS) ×1 IMPLANT
DEVICE DUBIN SPECIMEN MAMMOGRA (MISCELLANEOUS) ×2 IMPLANT
DRAPE CHEST BREAST 15X10 FENES (DRAPES) ×2 IMPLANT
ELECT COATED BLADE 2.86 ST (ELECTRODE) ×2 IMPLANT
ELECT REM PT RETURN 9FT ADLT (ELECTROSURGICAL) ×2
ELECTRODE REM PT RTRN 9FT ADLT (ELECTROSURGICAL) ×1 IMPLANT
GLOVE BIO SURGEON STRL SZ7 (GLOVE) ×4 IMPLANT
GLOVE BIOGEL PI IND STRL 7.5 (GLOVE) ×1 IMPLANT
GLOVE BIOGEL PI INDICATOR 7.5 (GLOVE) ×1
GOWN STRL REUS W/ TWL LRG LVL3 (GOWN DISPOSABLE) ×2 IMPLANT
GOWN STRL REUS W/TWL LRG LVL3 (GOWN DISPOSABLE) ×2
KIT BASIN OR (CUSTOM PROCEDURE TRAY) ×2 IMPLANT
KIT MARKER MARGIN INK (KITS) ×2 IMPLANT
LIGHT WAVEGUIDE WIDE FLAT (MISCELLANEOUS) IMPLANT
NEEDLE HYPO 25GX1X1/2 BEV (NEEDLE) ×2 IMPLANT
NS IRRIG 1000ML POUR BTL (IV SOLUTION) ×2 IMPLANT
PACK GENERAL/GYN (CUSTOM PROCEDURE TRAY) ×2 IMPLANT
STRIP CLOSURE SKIN 1/2X4 (GAUZE/BANDAGES/DRESSINGS) ×2 IMPLANT
SUT MNCRL AB 4-0 PS2 18 (SUTURE) ×2 IMPLANT
SUT MON AB 5-0 PS2 18 (SUTURE) IMPLANT
SUT SILK 2 0 SH (SUTURE) IMPLANT
SUT VIC AB 2-0 SH 27 (SUTURE) ×1
SUT VIC AB 2-0 SH 27XBRD (SUTURE) ×1 IMPLANT
SUT VIC AB 3-0 SH 27 (SUTURE) ×1
SUT VIC AB 3-0 SH 27X BRD (SUTURE) ×1 IMPLANT
SYR CONTROL 10ML LL (SYRINGE) ×2 IMPLANT
TOWEL GREEN STERILE (TOWEL DISPOSABLE) ×2 IMPLANT
TOWEL GREEN STERILE FF (TOWEL DISPOSABLE) ×2 IMPLANT

## 2020-04-10 NOTE — Anesthesia Procedure Notes (Signed)
Procedure Name: LMA Insertion Date/Time: 04/10/2020 7:40 AM Performed by: Dorthea Cove, CRNA Pre-anesthesia Checklist: Patient identified, Emergency Drugs available, Suction available and Patient being monitored Patient Re-evaluated:Patient Re-evaluated prior to induction Oxygen Delivery Method: Circle System Utilized Preoxygenation: Pre-oxygenation with 100% oxygen Induction Type: IV induction Ventilation: Mask ventilation without difficulty LMA: LMA inserted LMA Size: 4.0 Number of attempts: 1 Airway Equipment and Method: Bite block Placement Confirmation: positive ETCO2 Tube secured with: Tape Dental Injury: Teeth and Oropharynx as per pre-operative assessment

## 2020-04-10 NOTE — Anesthesia Postprocedure Evaluation (Signed)
Anesthesia Post Note  Patient: Denise Clarke  Procedure(s) Performed: RADIOACTIVE SEED GUIDED EXCISIONAL RIGHT BREAST BIOPSY (Right Breast)     Patient location during evaluation: PACU Anesthesia Type: General Level of consciousness: awake and alert Pain management: pain level controlled Vital Signs Assessment: post-procedure vital signs reviewed and stable Respiratory status: spontaneous breathing, nonlabored ventilation, respiratory function stable and patient connected to nasal cannula oxygen Cardiovascular status: blood pressure returned to baseline and stable Postop Assessment: no apparent nausea or vomiting Anesthetic complications: no   No complications documented.  Last Vitals:  Vitals:   04/10/20 0845 04/10/20 0900  BP: 130/89 127/90  Pulse: 88 73  Resp: 16 18  Temp:  36.7 C  SpO2: 97% 100%    Last Pain:  Vitals:   04/10/20 0900  PainSc: 0-No pain                 Barnet Glasgow

## 2020-04-10 NOTE — Transfer of Care (Signed)
Immediate Anesthesia Transfer of Care Note  Patient: Denise Clarke  Procedure(s) Performed: RADIOACTIVE SEED GUIDED EXCISIONAL RIGHT BREAST BIOPSY (Right Breast)  Patient Location: PACU  Anesthesia Type:General  Level of Consciousness: awake, alert  and oriented  Airway & Oxygen Therapy: Patient Spontanous Breathing and Patient connected to nasal cannula oxygen  Post-op Assessment: Report given to RN and Post -op Vital signs reviewed and stable  Post vital signs: Reviewed and stable  Last Vitals:  Vitals Value Taken Time  BP 126/86 04/10/20 0830  Temp    Pulse 92 04/10/20 0832  Resp 18 04/10/20 0832  SpO2 99 % 04/10/20 0832  Vitals shown include unvalidated device data.  Last Pain:  Vitals:   04/10/20 0622  PainSc: 0-No pain         Complications: No complications documented.

## 2020-04-10 NOTE — H&P (Signed)
Denise Clarke is an 40 y.o. female.   Chief Complaint: breast mass HPI:  72 yof who has no prior breast history, no fh who presents after screening mm shows c density breasts. there was a right breast mass. dx views confirmed this. US shows a 1.6x0.6x1.4 cm hypoechoic mass with distortion, [place] of axilla is negative. 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 this is concordant. she is referred for options   Past Medical History:  Diagnosis Date  . AMA (advanced maternal age) multigravida 8+   . Anxiety   . Complication of anesthesia   . Esophageal reflux   . Headache    migraine  . Hx of pre-eclampsia in prior pregnancy, currently pregnant   . Hypertension   . PONV (postoperative nausea and vomiting)    spinal anesthesia with C-section x2.   . Type O blood, Rh negative     Past Surgical History:  Procedure Laterality Date  . BUNIONECTOMY    . CESAREAN SECTION  2013  . CESAREAN SECTION WITH BILATERAL TUBAL LIGATION Bilateral 05/25/2016   Procedure: REPEAT CESAREAN SECTION WITH BILATERAL TUBAL LIGATION;  Surgeon: Gae Dry, MD;  Location: [facility] ORS;  Service: Obstetrics;  Laterality: Bilateral;  Female @ 1550 Apgars: 8/9 Weight: 7lb  2oz  . LAPAROSCOPY  2007    Family History  Problem Relation Age of Onset  . Diabetes Father   . Hypertension Father   . Cancer Brother        leukemia  . Breast cancer Maternal Grandmother 63  . Breast cancer Maternal Aunt    Social History:  reports that she has never smoked. She has never used smokeless tobacco. She reports current alcohol use. She reports that she does not use drugs.  Allergies:  Allergies  Allergen Reactions  . Sulfa Antibiotics Hives    As a child    Medications Prior to Admission  Medication Sig Dispense Refill  . atorvastatin (LIPITOR) 10 MG tablet TAKE 1 TABLET(10 MG) BY MOUTH DAILY (Patient taking differently: Take 10 mg by mouth daily.) 90 tablet 1  . cetirizine (ZYRTEC) 10 MG tablet Take  10 mg by mouth daily as needed for allergies.    . hydrochlorothiazide (HYDRODIURIL) 12.5 MG tablet TAKE 1 TABLET(12.5 MG) BY MOUTH DAILY (Patient taking differently: Take 12.5 mg by mouth daily.) 90 tablet 1  . sertraline (ZOLOFT) 50 MG tablet Take 0.5 tablets (25 mg total) by mouth daily. 90 tablet 1  . albuterol (VENTOLIN HFA) 108 (90 Base) MCG/ACT inhaler Inhale 2 puffs into the lungs every 6 (six) hours as needed for wheezing or shortness of breath. 6.7 g 0    No results found for this or any previous visit (from the past 48 hour(s)). [place] RT RADIOACTIVE SEED LOC  Result Date: 04/09/2020 CLINICAL DATA:  Patient for preoperative localization prior to right excision. EXAM: ULTRASOUND GUIDED RADIOACTIVE SEED LOCALIZATION OF THE RIGHT BREAST COMPARISON:  Previous exam(s). FINDINGS: Patient presents for radioactive seed localization prior to right excision. I met with the patient and we discussed the procedure of seed localization including benefits and alternatives. We discussed the high likelihood of a successful procedure. We discussed the risks of the procedure including infection, bleeding, tissue injury and further surgery. We discussed the low dose of radioactivity involved in the procedure. Informed, written consent was given. The usual time-out protocol was performed immediately prior to the procedure. Using ultrasound guidance, sterile technique, 1% lidocaine and an I-125 radioactive seed, right breast  mass/distortion 10 o'clock position was localized using a lateral approach. The follow-up mammogram images confirm the seed in the expected location and were marked for Dr. Donne Hazel. Follow-up survey of the patient confirms presence of the radioactive seed. Order number of I-125 seed:  [id number]. Total activity:  4.008 millicuries reference Date: 04/06/2020 The patient tolerated the procedure well and was released from the [facility]. She was given instructions regarding seed removal. IMPRESSION:  Radioactive seed localization right breast. No apparent complications. Electronically Signed   By: Lovey Newcomer M.D.   On: 04/09/2020 14:57   MM CLIP PLACEMENT RIGHT  Result Date: 04/09/2020 CLINICAL DATA:  Patient status post localization prior to right excision. EXAM: DIAGNOSTIC RIGHT MAMMOGRAM POST ULTRASOUND-GUIDED RADIOACTIVE SEED PLACEMENT COMPARISON:  Previous exam(s). FINDINGS: Mammographic images were obtained following ultrasound-guided radioactive seed placement. These demonstrate radioactive seed adjacent to the biopsy marking clip. IMPRESSION: Appropriate location of the radioactive seed. Final Assessment: Post Procedure Mammograms for Seed Placement Electronically Signed   By: Lovey Newcomer M.D.   On: 04/09/2020 14:57    Review of Systems  All other systems reviewed and are negative.   Blood pressure (!) 139/92, pulse 71, temperature 98 F (36.7 C), resp. rate 18, height 5' 4"  (1.626 m), weight 64.9 kg, last menstrual period 03/17/2020, SpO2 100 %. Physical Exam  General Mental Status-Alert. Orientation-Oriented X3. Breast Nipples-No Discharge. Breast Lump-No Palpable Breast Mass. Lymphatic Head & Neck General Head & Neck Lymphatics: Bilateral - Description - Normal. Axillary General Axillary Region: Bilateral - Description - Normal. Note: no [place] adenopathy  Assessment/Plan BREAST MASS, RIGHT (N63.10)  Right breast mass seed guided excisional biopsy we discussed option of close follow up vs excision. she desires excision. discussed upgrade rate. will plan for seed guided excision and await final pathology discussed surgery, risks and recovery   Rolm Bookbinder, MD 04/10/2020, 7:06 AM

## 2020-04-10 NOTE — Discharge Instructions (Signed)
[facility] Office Phone Number [phone]  BREAST BIOPSY/ PARTIAL MASTECTOMY: POST OP INSTRUCTIONS Take 400 mg of ibuprofen every 8 hours or 650 mg tylenol every 6 hours for next 72 hours then as needed. Use ice several times daily also. Always review your discharge instruction sheet given to you by the facility where your surgery was performed.  IF YOU HAVE DISABILITY OR FAMILY LEAVE FORMS, YOU MUST BRING THEM TO THE OFFICE FOR PROCESSING.  DO NOT GIVE THEM TO YOUR DOCTOR.  1. A prescription for pain medication may be given to you upon discharge.  Take your pain medication as prescribed, if needed.  If narcotic pain medicine is not needed, then you may take acetaminophen (Tylenol), naprosyn (Alleve) or ibuprofen (Advil) as needed. 2. Take your usually prescribed medications unless otherwise directed 3. If you need a refill on your pain medication, please contact your pharmacy.  They will contact our office to request authorization.  Prescriptions will not be filled after 5pm or on week-ends. 4. You should eat very light the first 24 hours after surgery, such as soup, crackers, pudding, etc.  Resume your normal diet the day after surgery. 5. Most patients will experience some swelling and bruising in the breast.  Ice packs and a good support bra will help.  Wear the breast binder provided or a sports bra for 72 hours day and night.  After that wear a sports bra during the day until you return to the office. Swelling and bruising can take several days to resolve.  6. It is common to experience some constipation if taking pain medication after surgery.  Increasing fluid intake and taking a stool softener will usually help or prevent this problem from occurring.  A mild laxative (Milk of Magnesia or Miralax) should be taken according to package directions if there are no bowel movements after 48 hours. 7. Unless discharge instructions indicate otherwise, you may remove your bandages 48  hours after surgery and you may shower at that time.  You may have steri-strips (small skin tapes) in place directly over the incision.  These strips should be left on the skin for 7-10 days and will come off on their own.  If your surgeon used skin glue on the incision, you may shower in 24 hours.  The glue will flake off over the next 2-3 weeks.  Any sutures or staples will be removed at the office during your follow-up visit. 8. ACTIVITIES:  You may resume regular daily activities (gradually increasing) beginning the next day.  Wearing a good support bra or sports bra minimizes pain and swelling.  You may have sexual intercourse when it is comfortable. a. You may drive when you no longer are taking prescription pain medication, you can comfortably wear a seatbelt, and you can safely maneuver your car and apply brakes. b. RETURN TO WORK:  ______________________________________________________________________________________ 9. You should see your doctor in the office for a follow-up appointment approximately two weeks after your surgery.  Your doctor's nurse will typically make your follow-up appointment when she calls you with your pathology report.  Expect your pathology report 3-4 business days after your surgery.  You may call to check if you do not hear from us after three days. 10. OTHER INSTRUCTIONS: _______________________________________________________________________________________________ _____________________________________________________________________________________________________________________________________ _____________________________________________________________________________________________________________________________________ _____________________________________________________________________________________________________________________________________  WHEN TO CALL DR Alto Gandolfo: 1. Fever over 101.0 2. Nausea and/or vomiting. 3. Extreme swelling or  bruising. 4. Continued bleeding from incision. 5. Increased pain, redness, or drainage from the incision.  The clinic   staff is available to answer your questions during regular business hours.  Please don't hesitate to call and ask to speak to one of the nurses for clinical concerns.  If you have a medical emergency, go to the nearest emergency room or call 911.  A surgeon from [facility] is always on call at the hospital.  For further questions, please visit [email or web address]  

## 2020-04-10 NOTE — Interval H&P Note (Signed)
History and Physical Interval Note:  04/10/2020 7:07 AM  Denise Clarke  has presented today for surgery, with the diagnosis of RIGHT BREAST MASS.  The various methods of treatment have been discussed with the patient and family. After consideration of risks, benefits and other options for treatment, the patient has consented to  Procedure(s): RADIOACTIVE SEED GUIDED EXCISIONAL RIGHT BREAST BIOPSY (Right) as a surgical intervention.  The patient's history has been reviewed, patient examined, no change in status, stable for surgery.  I have reviewed the patient's chart and labs.  Questions were answered to the patient's satisfaction.     Rolm Bookbinder

## 2020-04-10 NOTE — Op Note (Signed)
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 Serita Grammes Anesthesia: General Specimens:rightbreast tissue marked with paint containing clip, seed separate Drains: None Complications: None Estimated blood loss: Minimal Sponge and count was correct completion Disposition to recovery stable condition  Indications:39 yof who has no prior breast history, no fh who presents after screening mm shows c density breasts. there was a right breast mass. dx views confirmed this. US shows a 1.6x0.6x1.4 cm hypoechoic mass with distortion, [place] of axilla is negative. 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 this is concordant.we discussed excisional biopsy  Procedure: After informed consent was obtained the patient was taken to the operating room. She had a seed placed prior to beginning. I had these mammograms available in the operating room. She was given antibiotics. SCDs were placed. She was placed under general anesthesia without complication. She was prepped and draped in the standard sterile surgical fashion. Surgical timeout was then performed.  I located the seedin thecentral breast. Iinfiltrated Marcaine around this area first. I made a periareolar incision and dissected to the seed. I then used the neoprobe to guide excision of the seed and some of the surrounding tissue. Mammogram confirmed removal of the seed and the clip. The seed had come out of the biopsy cavity but this was clearly visualized.  Hemostasis was obtained. I closed the breast tissue with 2-0 Vicryl. The skin was closed with 3-0 Vicryland5-0Monocryl. GlueandSteri-Strips were applied. She tolerated this well was extubated and transferred to recovery stable.

## 2020-04-11 ENCOUNTER — Encounter (HOSPITAL_COMMUNITY): Payer: Self-pay | Admitting: General Surgery

## 2020-04-14 LAB — SURGICAL PATHOLOGY

## 2020-07-07 ENCOUNTER — Ambulatory Visit (INDEPENDENT_AMBULATORY_CARE_PROVIDER_SITE_OTHER): Payer: PRIVATE HEALTH INSURANCE | Admitting: Obstetrics & Gynecology

## 2020-07-07 ENCOUNTER — Encounter: Payer: Self-pay | Admitting: Obstetrics & Gynecology

## 2020-07-07 ENCOUNTER — Other Ambulatory Visit: Payer: Self-pay

## 2020-07-07 ENCOUNTER — Other Ambulatory Visit: Payer: Self-pay | Admitting: Family Medicine

## 2020-07-07 ENCOUNTER — Ambulatory Visit
Admission: RE | Admit: 2020-07-07 | Discharge: 2020-07-07 | Disposition: A | Discharge status: Home / Self Care | Payer: PRIVATE HEALTH INSURANCE | Source: Ambulatory Visit | Attending: Obstetrics & Gynecology | Admitting: Obstetrics & Gynecology

## 2020-07-07 VITALS — BP 130/80 | Ht 64.0 in | Wt 149.0 lb

## 2020-07-07 DIAGNOSIS — R1031 Right lower quadrant pain: Secondary | ICD-10-CM | POA: Insufficient documentation

## 2020-07-07 DIAGNOSIS — I10 Essential (primary) hypertension: Secondary | ICD-10-CM

## 2020-07-07 MED ORDER — OXYCODONE-ACETAMINOPHEN 5-325 MG PO TABS: 1.0000 | ORAL_TABLET | ORAL | 0 refills | Status: DC | PRN

## 2020-07-07 NOTE — Progress Notes (Signed)
Gynecology Pelvic Pain Evaluation   Chief Complaint:  Chief Complaint  Patient presents with   Pelvic Pain    Right side pain    History of Present Illness:   Patient is a 40 y.o. [id number] who LMP was Patient's last menstrual period was 06/21/2020., presents today for a problem visit.  She complains of pain.   Her pain is localized to the RLQ area, described as constant, sharp, and stabbing, began  3 days ago (after sex)  and its severity is described as severe. The pain radiates to the  other area of her abdomen, and then yesterday to her lower back. She has these associated symptoms which include constipation. Patient has these modifiers which include relaxation and pain medication that make it better and unable to associate with any factor that make it worse.  Context includes: Prior CS x2 and BTL; still has appendix; no prior h/o ovarian cysts except when on fertility meds..  Previous studies include none.  Denies n/v, f/c, abnormal bleeding (LMP 06/20/20).    PMHx: She  has a past medical history of AMA (advanced maternal age) multigravida 42+, Anxiety, Complication of anesthesia, Esophageal reflux, Headache, pre-eclampsia in prior pregnancy, currently pregnant, Hypertension, PONV (postoperative nausea and vomiting), and Type O blood, Rh negative. Also,  has a past surgical history that includes Cesarean section (2013); Bunionectomy; laparoscopy (2007); Cesarean section with bilateral tubal ligation (Bilateral, 05/25/2016); and Radioactive seed guided excisional breast biopsy (Right, 04/10/2020)., family history includes Breast cancer in her maternal aunt; Breast cancer (age of onset: 42) in her maternal grandmother; Cancer in her brother; Diabetes in her father; Hypertension in her father.,  reports that she has never smoked. She has never used smokeless tobacco. She reports current alcohol use. She reports that she does not use drugs.  She has a current medication list which includes the  following prescription(s): atorvastatin, cetirizine, hydrochlorothiazide, oxycodone-acetaminophen, sertraline, and tramadol. Also, is allergic to sulfa antibiotics.  Review of Systems  Constitutional:  Negative for chills, fever and malaise/fatigue.  HENT:  Negative for congestion, sinus pain and sore throat.   Eyes:  Negative for blurred vision and pain.  Respiratory:  Negative for cough and wheezing.   Cardiovascular:  Negative for chest pain and leg swelling.  Gastrointestinal:  Positive for abdominal pain. Negative for constipation, diarrhea, heartburn, nausea and vomiting.  Genitourinary:  Negative for dysuria, frequency, hematuria and urgency.  Musculoskeletal:  Negative for back pain, joint pain, myalgias and neck pain.  Skin:  Negative for itching and rash.  Neurological:  Negative for dizziness, tremors and weakness.  Endo/Heme/Allergies:  Does not bruise/bleed easily.  Psychiatric/Behavioral:  Negative for depression. The patient is not nervous/anxious and does not have insomnia.    Objective: BP 130/80   Ht 5\' 4"  (1.626 m)   Wt 149 lb (67.6 kg)   LMP 06/21/2020   BMI 25.58 kg/m  Physical Exam Constitutional:      General: She is not in acute distress.    Appearance: She is well-developed.  Genitourinary:     Right Labia: No rash or tenderness.    Left Labia: No tenderness or rash.    No vaginal erythema or bleeding.      Right Adnexa: tender and full.    Right Adnexa: no mass present.    Left Adnexa: not tender and no mass present.    No cervical motion tenderness, discharge, polyp or nabothian cyst.     Uterus is not enlarged.  No uterine mass detected.    Pelvic exam was performed with patient in the lithotomy position.  HENT:     Head: Normocephalic and atraumatic.     Nose: Nose normal.  Abdominal:     General: There is no distension.     Palpations: Abdomen is soft.     Tenderness: There is no abdominal tenderness.  Musculoskeletal:        General:  Normal range of motion.  Neurological:     Mental Status: She is alert and oriented to person, place, and time.     Cranial Nerves: No cranial nerve deficit.  Skin:    General: Skin is warm and dry.  Psychiatric:        Attention and Perception: Attention normal.        Mood and Affect: Mood and affect normal.        Speech: Speech normal.        Behavior: Behavior normal.        Thought Content: Thought content normal.        Judgment: Judgment normal.    Female chaperone present for pelvic portion of the physical exam  Assessment: 40 y.o. [id number] with:  1. RLQ abdominal pain Problem List Items Addressed This Visit     RLQ abdominal pain    -  Primary   Relevant Orders   US PELVIC COMPLETE WITH TRANSVAGINAL NSAIDs and even Percocet for temporary pain relief If ovarian cyst, management options discussed     Barnett Applebaum, MD, [facility], [facility] 07/07/2020  11:16 AM

## 2020-07-10 ENCOUNTER — Other Ambulatory Visit: Payer: Self-pay

## 2020-07-10 ENCOUNTER — Encounter: Payer: Self-pay | Admitting: Family Medicine

## 2020-07-10 ENCOUNTER — Ambulatory Visit (INDEPENDENT_AMBULATORY_CARE_PROVIDER_SITE_OTHER): Payer: PRIVATE HEALTH INSURANCE | Admitting: Family Medicine

## 2020-07-10 VITALS — BP 120/80 | HR 80 | Ht 64.0 in | Wt 147.0 lb

## 2020-07-10 DIAGNOSIS — E782 Mixed hyperlipidemia: Secondary | ICD-10-CM | POA: Diagnosis not present

## 2020-07-10 DIAGNOSIS — I10 Essential (primary) hypertension: Secondary | ICD-10-CM | POA: Diagnosis not present

## 2020-07-10 DIAGNOSIS — F064 Anxiety disorder due to known physiological condition: Secondary | ICD-10-CM | POA: Diagnosis not present

## 2020-07-10 MED ORDER — HYDROCHLOROTHIAZIDE 12.5 MG PO TABS: ORAL_TABLET | ORAL | 1 refills | Status: DC

## 2020-07-10 MED ORDER — SERTRALINE HCL 25 MG PO TABS: 25.0000 mg | ORAL_TABLET | Freq: Every day | ORAL | 1 refills | Status: DC

## 2020-07-10 MED ORDER — ATORVASTATIN CALCIUM 10 MG PO TABS: ORAL_TABLET | ORAL | 1 refills | Status: DC

## 2020-07-10 NOTE — Progress Notes (Signed)
Date:  07/10/2020   Name:  Denise Clarke   DOB:  [birth year]   MRN:  [id number]   Chief Complaint: Hypertension, Hyperlipidemia, and Depression  Hypertension This is a chronic problem. The current episode started more than 1 year ago. The problem has been gradually improving since onset. The problem is controlled. Pertinent negatives include no chest pain, headaches, palpitations or shortness of breath. There are no associated agents to hypertension. Risk factors for coronary artery disease include dyslipidemia. Past treatments include diuretics. The current treatment provides moderate improvement. There is no history of angina, kidney disease, CAD/MI, CVA, heart failure, left ventricular hypertrophy, PVD or retinopathy. There is no history of chronic renal disease, a hypertension causing med or renovascular disease.  Hyperlipidemia This is a chronic problem. The current episode started more than 1 year ago. Recent lipid tests were reviewed and are normal. She has no history of chronic renal disease. Factors aggravating her hyperlipidemia include thiazides. Pertinent negatives include no chest pain, myalgias or shortness of breath. Current antihyperlipidemic treatment includes statins. The current treatment provides mild improvement of lipids. There are no compliance problems.   Depression        This is a chronic problem.  Associated symptoms include no decreased concentration, no fatigue, no helplessness, no hopelessness, does not have insomnia, not irritable, no restlessness, no decreased interest, no body aches, no myalgias, no headaches, no indigestion, not sad and no suicidal ideas.  Past treatments include SSRIs - Selective serotonin reuptake inhibitors.  Previous treatment provided mild relief.  Lab Results  Component Value Date   CREATININE 0.85 04/07/2020   BUN 12 04/07/2020   NA 139 04/07/2020   K 3.7 04/07/2020   CL 102 04/07/2020   CO2 30 04/07/2020   Lab Results  Component  Value Date   CHOL 174 01/10/2020   HDL 58 01/10/2020   LDLCALC 104 (H) 01/10/2020   TRIG 61 01/10/2020   No results found for: TSH No results found for: HGBA1C Lab Results  Component Value Date   WBC 4.5 04/07/2020   HGB 15.9 (H) 04/07/2020   HCT 47.5 (H) 04/07/2020   MCV 90.8 04/07/2020   PLT 191 04/07/2020   Lab Results  Component Value Date   ALT 20 01/10/2020   AST 16 01/10/2020   ALKPHOS 58 01/10/2020   BILITOT 0.8 01/10/2020     Review of Systems  Constitutional:  Negative for fatigue.  HENT:  Negative for congestion.   Eyes:  Negative for visual disturbance.  Respiratory:  Negative for shortness of breath.   Cardiovascular:  Negative for chest pain and palpitations.  Gastrointestinal:  Negative for abdominal pain.  Genitourinary:  Negative for frequency and urgency.  Musculoskeletal:  Negative for myalgias.  Neurological:  Negative for headaches.  Psychiatric/Behavioral:  Positive for depression. Negative for decreased concentration and suicidal ideas. The patient does not have insomnia.    Patient Active Problem List   Diagnosis Date Noted   History of cervical dysplasia 11/11/2016   Hypertension, postpartum condition or complication 41/28/7867   History of classical cesarean section 05/25/2016   History of cesarean section, classical 03/11/2016   Anxiety disorder due to known physiological condition 04/18/2014   Bacterial upper respiratory infection 04/18/2014    Allergies  Allergen Reactions   Sulfa Antibiotics Hives    As a child    Past Surgical History:  Procedure Laterality Date   BUNIONECTOMY     CESAREAN SECTION  2013   CESAREAN SECTION WITH  BILATERAL TUBAL LIGATION Bilateral 05/25/2016   Procedure: REPEAT CESAREAN SECTION WITH BILATERAL TUBAL LIGATION;  Surgeon: Gae Dry, MD;  Location: [facility];  Service: Obstetrics;  Laterality: Bilateral;  Female @ 1550 Apgars: 8/9 Weight: 7lb  2oz   LAPAROSCOPY  2007   RADIOACTIVE SEED GUIDED  EXCISIONAL BREAST BIOPSY Right 04/10/2020   Procedure: RADIOACTIVE SEED GUIDED EXCISIONAL RIGHT BREAST BIOPSY;  Surgeon: Rolm Bookbinder, MD;  Location: [facility];  Service: General;  Laterality: Right;    Social History   Tobacco Use   Smoking status: Never   Smokeless tobacco: Never  Vaping Use   Vaping Use: Never used  Substance Use Topics   Alcohol use: Yes    Alcohol/week: 0.0 standard drinks    Comment: occasional social drinker   Drug use: No     Medication list has been reviewed and updated.  Current Meds  Medication Sig   atorvastatin (LIPITOR) 10 MG tablet TAKE 1 TABLET(10 MG) BY MOUTH DAILY (Patient taking differently: Take 10 mg by mouth daily.)   cetirizine (ZYRTEC) 10 MG tablet Take 10 mg by mouth daily as needed for allergies.   hydrochlorothiazide (HYDRODIURIL) 12.5 MG tablet TAKE 1 TABLET(12.5 MG) BY MOUTH DAILY   sertraline (ZOLOFT) 50 MG tablet Take 0.5 tablets (25 mg total) by mouth daily.   [DISCONTINUED] oxyCODONE-acetaminophen (PERCOCET/ROXICET) 5-325 MG tablet Take 1 tablet by mouth every 4 (four) hours as needed for severe pain.    PHQ 2/9 Scores 07/10/2020 01/10/2020 07/16/2019 06/28/2019  PHQ - 2 Score 0 0 0 0  PHQ- 9 Score 0 0 0 0    GAD 7 : Generalized Anxiety Score 07/10/2020 01/10/2020 07/16/2019 06/28/2019  Nervous, Anxious, on Edge 0 0 0 0  Control/stop worrying 0 0 0 0  Worry too much - different things 0 0 0 0  Trouble relaxing 0 0 0 0  Restless 0 0 0 0  Easily annoyed or irritable 0 0 0 0  Afraid - awful might happen 0 0 0 0  Total GAD 7 Score 0 0 0 0  Anxiety Difficulty - - - Not difficult at all    BP Readings from Last 3 Encounters:  07/10/20 120/80  07/07/20 130/80  04/10/20 127/90    Physical Exam Vitals and nursing note reviewed.  Constitutional:      General: She is not irritable.She is not in acute distress.    Appearance: She is not diaphoretic.  HENT:     Head: Normocephalic and atraumatic.     Right Ear: Tympanic membrane and  external ear normal.     Left Ear: Tympanic membrane and external ear normal.     Nose: Nose normal.     Mouth/Throat:     Mouth: Mucous membranes are moist.  Eyes:     General:        Right eye: No discharge.        Left eye: No discharge.     Conjunctiva/sclera: Conjunctivae normal.     Pupils: Pupils are equal, round, and reactive to light.  Neck:     Thyroid: No thyromegaly.     Vascular: No JVD.  Cardiovascular:     Rate and Rhythm: Normal rate and regular rhythm.     Heart sounds: Normal heart sounds. No murmur heard.   No friction rub. No gallop.  Pulmonary:     Effort: Pulmonary effort is normal.     Breath sounds: Normal breath sounds. No wheezing or rhonchi.  Abdominal:  General: Bowel sounds are normal.     Palpations: Abdomen is soft. There is no mass.     Tenderness: There is no abdominal tenderness. There is no guarding.  Musculoskeletal:        General: Normal range of motion.     Cervical back: Normal range of motion and neck supple.  Lymphadenopathy:     Cervical: No cervical adenopathy.  Skin:    General: Skin is warm and dry.     Findings: No erythema.  Neurological:     Mental Status: She is alert.     Cranial Nerves: No cranial nerve deficit.     Sensory: No sensory deficit.     Deep Tendon Reflexes: Reflexes are normal and symmetric.    Wt Readings from Last 3 Encounters:  07/10/20 147 lb (66.7 kg)  07/07/20 149 lb (67.6 kg)  04/10/20 143 lb (64.9 kg)    BP 120/80   Pulse 80   Ht 5\' 4"  (1.626 m)   Wt 147 lb (66.7 kg)   LMP 06/21/2020   BMI 25.23 kg/m   Assessment and Plan:  1. Essential hypertension Chronic.  Controlled.  Stable.  Blood pressure today is 120/80.  Continue hydrochlorothiazide 12.5 mg daily.  We will recheck in 6 months. - hydrochlorothiazide (HYDRODIURIL) 12.5 MG tablet; Take 1 tablet daily  Dispense: 90 tablet; Refill: 1  2. Mixed hyperlipidemia Chronic.  Controlled.  Stable.  Continue atorvastatin 10 mg once a  day.  Review of previous lipid panel is acceptable and will recheck in 6 months. - atorvastatin (LIPITOR) 10 MG tablet; TAKE 1 TABLET(10 MG) BY MOUTH DAILY  Dispense: 90 tablet; Refill: 1  3. Anxiety disorder due to known physiological condition Chronic.  Controlled.  Stable.  PHQ is 0 Gad score is 0 continue sertraline 25 mg daily we will recheck in 6 months. - sertraline (ZOLOFT) 25 MG tablet; Take 1 tablet (25 mg total) by mouth daily.  Dispense: 90 tablet; Refill: 1

## 2020-09-09 ENCOUNTER — Other Ambulatory Visit: Payer: Self-pay

## 2020-09-09 MED ORDER — ACYCLOVIR 400 MG PO TABS: 800.0000 mg | ORAL_TABLET | Freq: Two times a day (BID) | ORAL | 0 refills | Status: DC

## 2020-10-24 ENCOUNTER — Other Ambulatory Visit: Payer: Self-pay | Admitting: Obstetrics & Gynecology

## 2020-12-25 ENCOUNTER — Other Ambulatory Visit: Payer: Self-pay

## 2020-12-25 ENCOUNTER — Encounter: Payer: Self-pay | Admitting: Obstetrics & Gynecology

## 2020-12-25 ENCOUNTER — Ambulatory Visit (INDEPENDENT_AMBULATORY_CARE_PROVIDER_SITE_OTHER): Payer: Self-pay | Admitting: Obstetrics & Gynecology

## 2020-12-25 VITALS — BP 122/70 | Ht 64.0 in | Wt 130.0 lb

## 2020-12-25 DIAGNOSIS — Z1231 Encounter for screening mammogram for malignant neoplasm of breast: Secondary | ICD-10-CM

## 2020-12-25 DIAGNOSIS — Z01419 Encounter for gynecological examination (general) (routine) without abnormal findings: Secondary | ICD-10-CM

## 2020-12-25 NOTE — Patient Instructions (Signed)
Recommendations to boost your immunity to prevent illness such as viral flu and colds, including covid19, are as follows:       - - -  Vitamin K2 and Vitamin D3  - - - Take Vitamin K2 at 200-300 mcg daily (usually 2-3 pills daily of the over the counter formulation). Take Vitamin D3 at 3000-4000 U daily (usually 3-4 pills daily of the over the counter formulation). Studies show that these two at high normal levels in your system are very effective in keeping your immunity so strong and protective that you will be unlikely to contract viral illness such as those listed above.  Dr Kenton Kingfisher

## 2020-12-25 NOTE — Progress Notes (Signed)
HPI:      [name] is a 40 y.o. [id number] who LMP was Patient's last menstrual period was 12/09/2020 (exact date)., she presents today for her annual examination. The patient has no complaints today. The patient is sexually active. Her last pap: approximate date 2021 and was normal and last mammogram: approximate date 03/2020 and was abnormal: has had breast biopsy and lumpectomy (sclerosing papilloma) . The patient does perform self breast exams.  There is no notable family history of breast or ovarian cancer in her family.  The patient has regular exercise: yes.  The patient denies current symptoms of depression.    GYN History: Contraception: tubal ligation  PMHx: Past Medical History:  Diagnosis Date   AMA (advanced maternal age) multigravida 35+    Anxiety    Complication of anesthesia    Esophageal reflux    Headache    migraine   Hx of pre-eclampsia in prior pregnancy, currently pregnant    Hypertension    PONV (postoperative nausea and vomiting)    spinal anesthesia with C-section x2.    Type O blood, Rh negative    Past Surgical History:  Procedure Laterality Date   BREAST SURGERY     BUNIONECTOMY     CESAREAN SECTION  2013   CESAREAN SECTION WITH BILATERAL TUBAL LIGATION Bilateral 05/25/2016   Procedure: REPEAT CESAREAN SECTION WITH BILATERAL TUBAL LIGATION;  Surgeon: Gae Dry, MD;  Location: [facility];  Service: Obstetrics;  Laterality: Bilateral;  Female @ 1550 Apgars: 8/9 Weight: 7lb  2oz   LAPAROSCOPY  2007   RADIOACTIVE SEED GUIDED EXCISIONAL BREAST BIOPSY Right 04/10/2020   Procedure: RADIOACTIVE SEED GUIDED EXCISIONAL RIGHT BREAST BIOPSY;  Surgeon: Rolm Bookbinder, MD;  Location: [place];  Service: General;  Laterality: Right;   Family History  Problem Relation Age of Onset   Diabetes Father    Hypertension Father    Cancer Brother        leukemia   Breast cancer Maternal Grandmother 23   Breast cancer Maternal Aunt    Social History    Tobacco Use   Smoking status: Never   Smokeless tobacco: Never  Vaping Use   Vaping Use: Never used  Substance Use Topics   Alcohol use: Yes    Alcohol/week: 0.0 standard drinks    Comment: occasional social drinker   Drug use: No    Current Outpatient Medications:    acyclovir (ZOVIRAX) 400 MG tablet, TAKE 2 TABLETS(800 MG) BY MOUTH TWICE DAILY, Disp: 30 tablet, Rfl: 0   atorvastatin (LIPITOR) 10 MG tablet, TAKE 1 TABLET(10 MG) BY MOUTH DAILY, Disp: 90 tablet, Rfl: 1   cetirizine (ZYRTEC) 10 MG tablet, Take 10 mg by mouth daily as needed for allergies., Disp: , Rfl:    hydrochlorothiazide (HYDRODIURIL) 12.5 MG tablet, Take 1 tablet daily, Disp: 90 tablet, Rfl: 1   sertraline (ZOLOFT) 25 MG tablet, Take 1 tablet (25 mg total) by mouth daily., Disp: 90 tablet, Rfl: 1 Allergies: Sulfa antibiotics  Review of Systems  Constitutional:  Negative for chills, fever and malaise/fatigue.  HENT:  Negative for congestion, sinus pain and sore throat.   Eyes:  Negative for blurred vision and pain.  Respiratory:  Negative for cough and wheezing.   Cardiovascular:  Negative for chest pain and leg swelling.  Gastrointestinal:  Negative for abdominal pain, constipation, diarrhea, heartburn, nausea and vomiting.  Genitourinary:  Negative for dysuria, frequency, hematuria and urgency.  Musculoskeletal:  Negative for back pain, joint  pain, myalgias and neck pain.  Skin:  Negative for itching and rash.  Neurological:  Negative for dizziness, tremors and weakness.  Endo/Heme/Allergies:  Does not bruise/bleed easily.  Psychiatric/Behavioral:  Negative for depression. The patient is not nervous/anxious and does not have insomnia.    Objective: BP 122/70    Ht 5\' 4"  (1.626 m)    Wt 130 lb (59 kg)    LMP 12/09/2020 (Exact Date)    BMI 22.31 kg/m   Filed Weights   12/25/20 0854  Weight: 130 lb (59 kg)   Body mass index is 22.31 kg/m. Physical Exam Constitutional:      General: She is not in acute  distress.    Appearance: She is well-developed.  Genitourinary:     Bladder, rectum and urethral meatus normal.     No lesions in the vagina.     Right Labia: No rash, tenderness or lesions.    Left Labia: No tenderness, lesions or rash.    No vaginal bleeding.      Right Adnexa: not tender and no mass present.    Left Adnexa: not tender and no mass present.    No cervical motion tenderness, friability, lesion or polyp.     Uterus is not enlarged.     No uterine mass detected.    Pelvic exam was performed with patient in the lithotomy position.  Breasts:    Right: No mass, skin change or tenderness.     Left: No mass, skin change or tenderness.  HENT:     Head: Normocephalic and atraumatic. No laceration.     Right Ear: Hearing normal.     Left Ear: Hearing normal.     Mouth/Throat:     Pharynx: Uvula midline.  Eyes:     Pupils: Pupils are equal, round, and reactive to light.  Neck:     Thyroid: No thyromegaly.  Cardiovascular:     Rate and Rhythm: Normal rate and regular rhythm.     Heart sounds: No murmur heard.   No friction rub. No gallop.  Pulmonary:     Effort: Pulmonary effort is normal. No respiratory distress.     Breath sounds: Normal breath sounds. No wheezing.  Abdominal:     General: Bowel sounds are normal. There is no distension.     Palpations: Abdomen is soft.     Tenderness: There is no abdominal tenderness. There is no rebound.  Musculoskeletal:        General: Normal range of motion.     Cervical back: Normal range of motion and neck supple.  Neurological:     Mental Status: She is alert and oriented to person, place, and time.     Cranial Nerves: No cranial nerve deficit.  Skin:    General: Skin is warm and dry.  Psychiatric:        Judgment: Judgment normal.  Vitals reviewed.    Assessment:  ANNUAL EXAM 1. Women's annual routine gynecological examination   2. Encounter for screening mammogram for malignant neoplasm of breast       Screening Plan:            1.  Cervical Screening-  Pap smear schedule reviewed with patient  2. 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  Labs managed by PCP  4. Counseling for contraception: bilateral tubal ligation  [facility] - 12/25/20 9892  Pregnancy Intention Screening   Does the patient want to become pregnant in the next year? No    Does the patient's partner want to become pregnant in the next year? No    Would the patient like to discuss contraceptive options today? No      Contraception Wrap Up   Current Method Female Sterilization    End Method Female Sterilization    Contraception Counseling Provided No               F/U  Return in about 1 year (around 12/25/2021) for Annual.  Barnett Applebaum, MD, [facility], [facility] 12/25/2020  9:21 AM

## 2021-01-18 ENCOUNTER — Other Ambulatory Visit: Payer: Self-pay | Admitting: Obstetrics & Gynecology

## 2021-01-22 ENCOUNTER — Encounter: Payer: Self-pay | Admitting: Family Medicine

## 2021-01-22 ENCOUNTER — Ambulatory Visit: Payer: BC Managed Care – PPO | Admitting: Family Medicine

## 2021-01-22 ENCOUNTER — Other Ambulatory Visit: Payer: Self-pay

## 2021-01-22 VITALS — BP 118/88 | HR 85 | Ht 64.0 in | Wt 131.0 lb

## 2021-01-22 DIAGNOSIS — E782 Mixed hyperlipidemia: Secondary | ICD-10-CM

## 2021-01-22 DIAGNOSIS — J029 Acute pharyngitis, unspecified: Secondary | ICD-10-CM | POA: Diagnosis not present

## 2021-01-22 DIAGNOSIS — F064 Anxiety disorder due to known physiological condition: Secondary | ICD-10-CM | POA: Diagnosis not present

## 2021-01-22 DIAGNOSIS — I10 Essential (primary) hypertension: Secondary | ICD-10-CM | POA: Diagnosis not present

## 2021-01-22 MED ORDER — AZITHROMYCIN 250 MG PO TABS: ORAL_TABLET | ORAL | 0 refills | Status: AC

## 2021-01-22 MED ORDER — ATORVASTATIN CALCIUM 10 MG PO TABS: ORAL_TABLET | ORAL | 1 refills | Status: DC

## 2021-01-22 MED ORDER — SERTRALINE HCL 25 MG PO TABS: 25.0000 mg | ORAL_TABLET | Freq: Every day | ORAL | 1 refills | Status: DC

## 2021-01-22 MED ORDER — HYDROCHLOROTHIAZIDE 12.5 MG PO TABS: ORAL_TABLET | ORAL | 1 refills | Status: DC

## 2021-01-22 NOTE — Progress Notes (Signed)
Date:  01/22/2021   Name:  Denise Clarke   DOB:  [birth year]   MRN:  [id number]   Chief Complaint: Hyperlipidemia, Hypertension, and Depression  Hyperlipidemia  Hypertension  Depression        Lab Results  Component Value Date   NA 139 04/07/2020   K 3.7 04/07/2020   CO2 30 04/07/2020   GLUCOSE 102 (H) 04/07/2020   BUN 12 04/07/2020   CREATININE 0.85 04/07/2020   CALCIUM 9.5 04/07/2020   GFRNONAA >60 04/07/2020   Lab Results  Component Value Date   CHOL 174 01/10/2020   HDL 58 01/10/2020   LDLCALC 104 (H) 01/10/2020   TRIG 61 01/10/2020   No results found for: TSH No results found for: HGBA1C Lab Results  Component Value Date   WBC 4.5 04/07/2020   HGB 15.9 (H) 04/07/2020   HCT 47.5 (H) 04/07/2020   MCV 90.8 04/07/2020   PLT 191 04/07/2020   Lab Results  Component Value Date   ALT 20 01/10/2020   AST 16 01/10/2020   ALKPHOS 58 01/10/2020   BILITOT 0.8 01/10/2020   No results found for: 25OHVITD2, 25OHVITD3, VD25OH   Review of Systems  Psychiatric/Behavioral:  Positive for depression.    Patient Active Problem List   Diagnosis Date Noted   History of cervical dysplasia 11/11/2016   Hypertension, postpartum condition or complication 93/71/6967   History of classical cesarean section 05/25/2016   History of cesarean section, classical 03/11/2016   Anxiety disorder due to known physiological condition 04/18/2014   Bacterial upper respiratory infection 04/18/2014    Allergies  Allergen Reactions   Sulfa Antibiotics Hives    As a child    Past Surgical History:  Procedure Laterality Date   BREAST SURGERY     BUNIONECTOMY     CESAREAN SECTION  2013   CESAREAN SECTION WITH BILATERAL TUBAL LIGATION Bilateral 05/25/2016   Procedure: REPEAT CESAREAN SECTION WITH BILATERAL TUBAL LIGATION;  Surgeon: Gae Dry, MD;  Location: [facility];  Service: Obstetrics;  Laterality: Bilateral;  Female @ 1550 Apgars: 8/9 Weight: 7lb  2oz   LAPAROSCOPY   2007   RADIOACTIVE SEED GUIDED EXCISIONAL BREAST BIOPSY Right 04/10/2020   Procedure: RADIOACTIVE SEED GUIDED EXCISIONAL RIGHT BREAST BIOPSY;  Surgeon: Rolm Bookbinder, MD;  Location: [facility];  Service: General;  Laterality: Right;    Social History   Tobacco Use   Smoking status: Never   Smokeless tobacco: Never  Vaping Use   Vaping Use: Never used  Substance Use Topics   Alcohol use: Yes    Alcohol/week: 0.0 standard drinks    Comment: occasional social drinker   Drug use: No     Medication list has been reviewed and updated.  Current Meds  Medication Sig   acyclovir (ZOVIRAX) 400 MG tablet TAKE 2 TABLETS(800 MG) BY MOUTH TWICE DAILY   atorvastatin (LIPITOR) 10 MG tablet TAKE 1 TABLET(10 MG) BY MOUTH DAILY   cetirizine (ZYRTEC) 10 MG tablet Take 10 mg by mouth daily as needed for allergies.   hydrochlorothiazide (HYDRODIURIL) 12.5 MG tablet Take 1 tablet daily   sertraline (ZOLOFT) 25 MG tablet Take 1 tablet (25 mg total) by mouth daily.    PHQ 2/9 Scores 01/22/2021 07/10/2020 01/10/2020 07/16/2019  PHQ - 2 Score 0 0 0 0  PHQ- 9 Score 0 0 0 0    GAD 7 : Generalized Anxiety Score 01/22/2021 07/10/2020 01/10/2020 07/16/2019  Nervous, Anxious, on Edge 0 0 0 0  Control/stop worrying 0 0 0 0  Worry too much - different things 0 0 0 0  Trouble relaxing 0 0 0 0  Restless 0 0 0 0  Easily annoyed or irritable 0 0 0 0  Afraid - awful might happen 0 0 0 0  Total GAD 7 Score 0 0 0 0  Anxiety Difficulty - - - -    BP Readings from Last 3 Encounters:  01/22/21 118/88  12/25/20 122/70  07/10/20 120/80    Physical Exam  Wt Readings from Last 3 Encounters:  01/22/21 131 lb (59.4 kg)  12/25/20 130 lb (59 kg)  07/10/20 147 lb (66.7 kg)    BP 118/88    Pulse 85    Ht 5\' 4"  (1.626 m)    Wt 131 lb (59.4 kg)    LMP 12/26/2020 (Approximate)    SpO2 98%    BMI 22.49 kg/m   Assessment and Plan:

## 2021-01-22 NOTE — Progress Notes (Signed)
Date:  01/22/2021   Name:  Denise Clarke   DOB:  January 30, 1980   MRN:  [id number]   Chief Complaint: Hyperlipidemia, Hypertension, and Depression  Hyperlipidemia This is a chronic problem. The current episode started more than 1 year ago. The problem is controlled. Recent lipid tests were reviewed and are normal. She has no history of chronic renal disease, diabetes, hypothyroidism, liver disease, obesity or nephrotic syndrome. There are no known factors aggravating her hyperlipidemia. Pertinent negatives include no chest pain, focal sensory loss, focal weakness, leg pain, myalgias or shortness of breath. Current antihyperlipidemic treatment includes statins. The current treatment provides moderate improvement of lipids. There are no compliance problems.  Risk factors for coronary artery disease include dyslipidemia and hypertension.  Hypertension This is a chronic problem. The current episode started more than 1 year ago. The problem has been waxing and waning since onset. The problem is controlled. Pertinent negatives include no anxiety, blurred vision, chest pain, headaches, malaise/fatigue, neck pain, orthopnea, palpitations, peripheral edema, PND, shortness of breath or sweats. Risk factors for coronary artery disease include dyslipidemia. Past treatments include diuretics. The current treatment provides moderate improvement. There are no compliance problems.  There is no history of angina, kidney disease, CAD/MI, CVA, heart failure, left ventricular hypertrophy, PVD or retinopathy. There is no history of chronic renal disease, a hypertension causing med or renovascular disease.  Depression        This is a chronic problem.  The current episode started more than 1 year ago.   The onset quality is gradual.   The problem occurs intermittently.  The problem has been gradually improving since onset.  Associated symptoms include no decreased concentration, no fatigue, no helplessness, no  hopelessness, does not have insomnia, not irritable, no restlessness, no decreased interest, no appetite change, no body aches, no myalgias, no headaches, no indigestion, not sad and no suicidal ideas.  Past treatments include SSRIs - Selective serotonin reuptake inhibitors.  Compliance with treatment is good.  Previous treatment provided moderate relief.   Pertinent negatives include no hypothyroidism and no anxiety.  Lab Results  Component Value Date   NA 139 04/07/2020   K 3.7 04/07/2020   CO2 30 04/07/2020   GLUCOSE 102 (H) 04/07/2020   BUN 12 04/07/2020   CREATININE 0.85 04/07/2020   CALCIUM 9.5 04/07/2020   GFRNONAA >60 04/07/2020   Lab Results  Component Value Date   CHOL 174 01/10/2020   HDL 58 01/10/2020   LDLCALC 104 (H) 01/10/2020   TRIG 61 01/10/2020   No results found for: TSH No results found for: HGBA1C Lab Results  Component Value Date   WBC 4.5 04/07/2020   HGB 15.9 (H) 04/07/2020   HCT 47.5 (H) 04/07/2020   MCV 90.8 04/07/2020   PLT 191 04/07/2020   Lab Results  Component Value Date   ALT 20 01/10/2020   AST 16 01/10/2020   ALKPHOS 58 01/10/2020   BILITOT 0.8 01/10/2020   No results found for: 25OHVITD2, 25OHVITD3, VD25OH   Review of Systems  Constitutional:  Negative for appetite change, chills, fatigue, fever and malaise/fatigue.  HENT:  Negative for drooling, ear discharge, ear pain and sore throat.   Eyes:  Negative for blurred vision.  Respiratory:  Negative for cough, shortness of breath and wheezing.   Cardiovascular:  Negative for chest pain, palpitations, orthopnea, leg swelling and PND.  Gastrointestinal:  Negative for abdominal pain, blood in stool, constipation, diarrhea and nausea.  Endocrine: Negative for polydipsia.  Genitourinary:  Negative for dysuria, frequency, hematuria and urgency.  Musculoskeletal:  Negative for back pain, myalgias and neck pain.  Skin:  Negative for rash.  Allergic/Immunologic: Negative for environmental  allergies.  Neurological:  Negative for dizziness, focal weakness and headaches.  Hematological:  Does not bruise/bleed easily.  Psychiatric/Behavioral:  Positive for depression. Negative for decreased concentration and suicidal ideas. The patient is not nervous/anxious and does not have insomnia.    Patient Active Problem List   Diagnosis Date Noted   History of cervical dysplasia 11/11/2016   Hypertension, postpartum condition or complication 32/20/2542   History of classical cesarean section 05/25/2016   History of cesarean section, classical 03/11/2016   Anxiety disorder due to known physiological condition 04/18/2014   Bacterial upper respiratory infection 04/18/2014    Allergies  Allergen Reactions   Sulfa Antibiotics Hives    As a child    Past Surgical History:  Procedure Laterality Date   BREAST SURGERY     BUNIONECTOMY     CESAREAN SECTION  2013   CESAREAN SECTION WITH BILATERAL TUBAL LIGATION Bilateral 05/25/2016   Procedure: REPEAT CESAREAN SECTION WITH BILATERAL TUBAL LIGATION;  Surgeon: Gae Dry, MD;  Location: [facility];  Service: Obstetrics;  Laterality: Bilateral;  Female @ 1550 Apgars: 8/9 Weight: 7lb  2oz   LAPAROSCOPY  2007   RADIOACTIVE SEED GUIDED EXCISIONAL BREAST BIOPSY Right 04/10/2020   Procedure: RADIOACTIVE SEED GUIDED EXCISIONAL RIGHT BREAST BIOPSY;  Surgeon: Rolm Bookbinder, MD;  Location: [place];  Service: General;  Laterality: Right;    Social History   Tobacco Use   Smoking status: Never   Smokeless tobacco: Never  Vaping Use   Vaping Use: Never used  Substance Use Topics   Alcohol use: Yes    Alcohol/week: 0.0 standard drinks    Comment: occasional social drinker   Drug use: No     Medication list has been reviewed and updated.  Current Meds  Medication Sig   acyclovir (ZOVIRAX) 400 MG tablet TAKE 2 TABLETS(800 MG) BY MOUTH TWICE DAILY   atorvastatin (LIPITOR) 10 MG tablet TAKE 1 TABLET(10 MG) BY MOUTH DAILY    cetirizine (ZYRTEC) 10 MG tablet Take 10 mg by mouth daily as needed for allergies.   hydrochlorothiazide (HYDRODIURIL) 12.5 MG tablet Take 1 tablet daily   sertraline (ZOLOFT) 25 MG tablet Take 1 tablet (25 mg total) by mouth daily.    PHQ 2/9 Scores 01/22/2021 07/10/2020 01/10/2020 07/16/2019  PHQ - 2 Score 0 0 0 0  PHQ- 9 Score 0 0 0 0    GAD 7 : Generalized Anxiety Score 01/22/2021 07/10/2020 01/10/2020 07/16/2019  Nervous, Anxious, on Edge 0 0 0 0  Control/stop worrying 0 0 0 0  Worry too much - different things 0 0 0 0  Trouble relaxing 0 0 0 0  Restless 0 0 0 0  Easily annoyed or irritable 0 0 0 0  Afraid - awful might happen 0 0 0 0  Total GAD 7 Score 0 0 0 0  Anxiety Difficulty - - - -    BP Readings from Last 3 Encounters:  01/22/21 118/88  12/25/20 122/70  07/10/20 120/80    Physical Exam Vitals and nursing note reviewed.  Constitutional:      General: She is not irritable.    Appearance: She is well-developed.  HENT:     Head: Normocephalic.     Right Ear: Tympanic membrane and external ear normal.     Left Ear: Tympanic  membrane and external ear normal.     Nose: Nose normal. No congestion or rhinorrhea.     Mouth/Throat:     Mouth: Mucous membranes are dry.     Pharynx: Posterior oropharyngeal erythema present. No pharyngeal swelling, oropharyngeal exudate or uvula swelling.  Eyes:     General: Lids are everted, no foreign bodies appreciated. No scleral icterus.       Left eye: No foreign body or hordeolum.     Conjunctiva/sclera: Conjunctivae normal.     Right eye: Right conjunctiva is not injected.     Left eye: Left conjunctiva is not injected.     Pupils: Pupils are equal, round, and reactive to light.  Neck:     Thyroid: No thyromegaly.     Vascular: Normal carotid pulses. No carotid bruit, hepatojugular reflux or JVD.     Trachea: Trachea and phonation normal. No tracheal deviation.  Cardiovascular:     Rate and Rhythm: Normal rate and regular rhythm.      Heart sounds: Normal heart sounds. No murmur heard.   No friction rub. No gallop.  Pulmonary:     Effort: Pulmonary effort is normal. No respiratory distress.     Breath sounds: Normal breath sounds. No wheezing, rhonchi or rales.  Abdominal:     General: Bowel sounds are normal.     Palpations: Abdomen is soft. There is no mass.     Tenderness: There is no abdominal tenderness. There is no right CVA tenderness, left CVA tenderness, guarding or rebound.  Musculoskeletal:        General: No swelling or tenderness. Normal range of motion.     Cervical back: Full passive range of motion without pain, normal range of motion and neck supple.  Lymphadenopathy:     Cervical: No cervical adenopathy.     Right cervical: No superficial, deep or posterior cervical adenopathy.    Left cervical: No superficial, deep or posterior cervical adenopathy.  Skin:    General: Skin is warm.     Findings: No rash.  Neurological:     Mental Status: She is alert and oriented to person, place, and time.     Cranial Nerves: No cranial nerve deficit.     Deep Tendon Reflexes: Reflexes normal.  Psychiatric:        Mood and Affect: Mood is not anxious or depressed.    Wt Readings from Last 3 Encounters:  01/22/21 131 lb (59.4 kg)  12/25/20 130 lb (59 kg)  07/10/20 147 lb (66.7 kg)    BP 118/88    Pulse 85    Ht 5\' 4"  (1.626 m)    Wt 131 lb (59.4 kg)    LMP 12/26/2020 (Approximate)    SpO2 98%    BMI 22.49 kg/m   Assessment and Plan:  1. Mixed hyperlipidemia Chronic.  Controlled.  Stable.  Continue atorvastatin 10 mg once a day.  Will check lipid panel. - atorvastatin (LIPITOR) 10 MG tablet; TAKE 1 TABLET(10 MG) BY MOUTH DAILY  Dispense: 90 tablet; Refill: 1 - Lipid Panel With LDL/HDL Ratio  2. Essential hypertension Chronic.  Controlled.  Stable.  Blood pressure today is 118/88.  Continue hydrochlorothiazide 12.5 mg once a day.  Will check CMP for electrolytes - hydrochlorothiazide (HYDRODIURIL) 12.5  MG tablet; Take 1 tablet daily  Dispense: 90 tablet; Refill: 1 - Comprehensive Metabolic Panel (CMET)  3. Anxiety disorder due to known physiological condition Chronic.  Controlled.  Stable.  PHQ is 0 Gad score is  0 continue sertraline 25 mg once a day. - sertraline (ZOLOFT) 25 MG tablet; Take 1 tablet (25 mg total) by mouth daily.  Dispense: 90 tablet; Refill: 1  4. Pharyngitis, unspecified etiology New onset.  Persistent.  Exam notes erythematous pharynx.  There is no exudate there is no adenopathy.  We will treat with azithromycin to 50 mg today followed by 1 a day for 4 days. - azithromycin (ZITHROMAX) 250 MG tablet; Take 2 tablets on day 1, then 1 tablet daily on days 2 through 5  Dispense: 6 tablet; Refill: 0

## 2021-01-23 LAB — COMPREHENSIVE METABOLIC PANEL
ALT: 18 IU/L (ref 0–32)
AST: 15 IU/L (ref 0–40)
Albumin/Globulin Ratio: 2.2 (ref 1.2–2.2)
Albumin: 5 g/dL — ABNORMAL HIGH (ref 3.8–4.8)
Alkaline Phosphatase: 68 IU/L (ref 44–121)
BUN/Creatinine Ratio: 17 (ref 9–23)
BUN: 15 mg/dL (ref 6–24)
Bilirubin Total: 0.5 mg/dL (ref 0.0–1.2)
CO2: 25 mmol/L (ref 20–29)
Calcium: 9.6 mg/dL (ref 8.7–10.2)
Chloride: 100 mmol/L (ref 96–106)
Creatinine, Ser: 0.86 mg/dL (ref 0.57–1.00)
Globulin, Total: 2.3 g/dL (ref 1.5–4.5)
Glucose: 91 mg/dL (ref 70–99)
Potassium: 4.8 mmol/L (ref 3.5–5.2)
Sodium: 139 mmol/L (ref 134–144)
Total Protein: 7.3 g/dL (ref 6.0–8.5)
eGFR: 87 mL/min/{1.73_m2} (ref >59)

## 2021-01-23 LAB — LIPID PANEL WITH LDL/HDL RATIO
Cholesterol, Total: 197 mg/dL (ref 100–199)
HDL: 67 mg/dL (ref >39)
LDL Chol Calc (NIH): 119 mg/dL — ABNORMAL HIGH (ref 0–99)
LDL/HDL Ratio: 1.8 ratio (ref 0.0–3.2)
Triglycerides: 61 mg/dL (ref 0–149)
VLDL Cholesterol Cal: 11 mg/dL (ref 5–40)

## 2021-03-26 ENCOUNTER — Other Ambulatory Visit: Payer: Self-pay | Admitting: Obstetrics & Gynecology

## 2021-07-08 IMAGING — US US  BREAST BX W/ LOC DEV 1ST LESION IMG BX SPEC US GUIDE*R*
1 series · 11 of 11 positions shown · non-contrast
Comparison: Previous exam(s).
COMPARISON: Previous exam(s).

Addendum:
CLINICAL DATA: 39-year-old female presenting for ultrasound-guided
biopsy of a right breast mass.

EXAM:
ULTRASOUND GUIDED RIGHT BREAST CORE NEEDLE BIOPSY

[Series 1: us breast bx w/ loc dev 1st lesion img bx spec us  · 0.06mm/px · 11 of 11 slices shown]
[im 1/11]
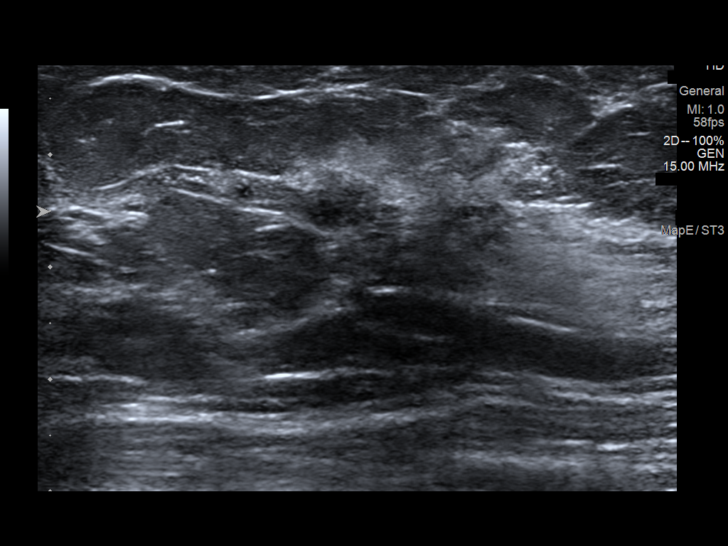
[im 2/11]
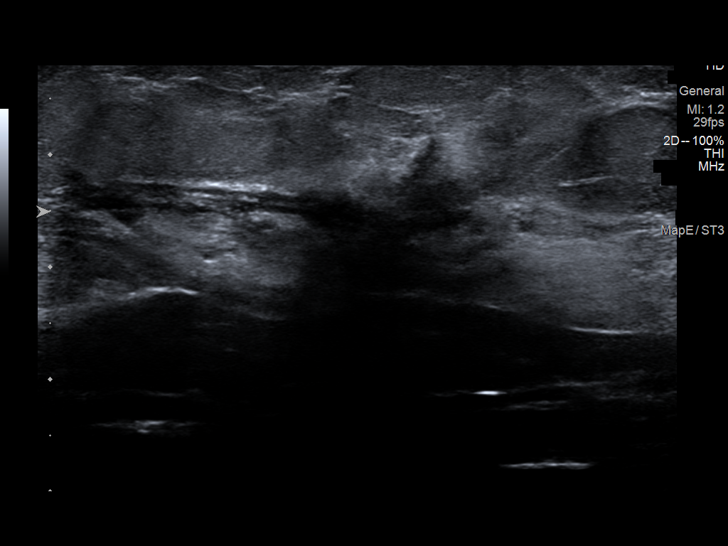
[im 3/11]
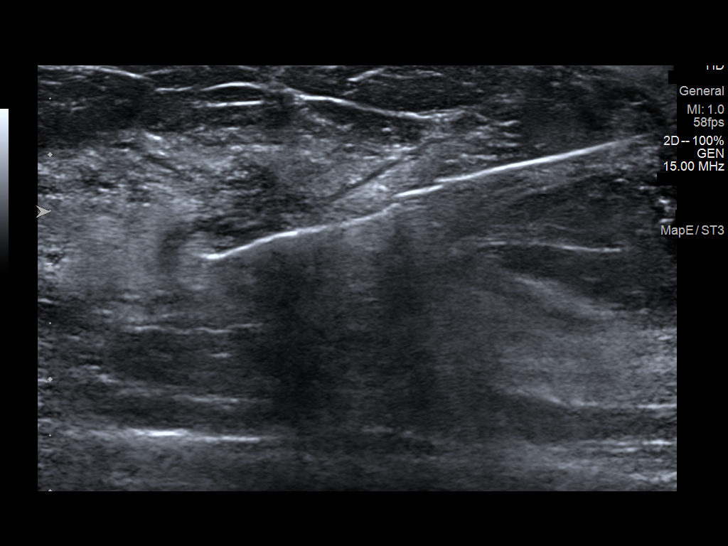
[im 4/11]
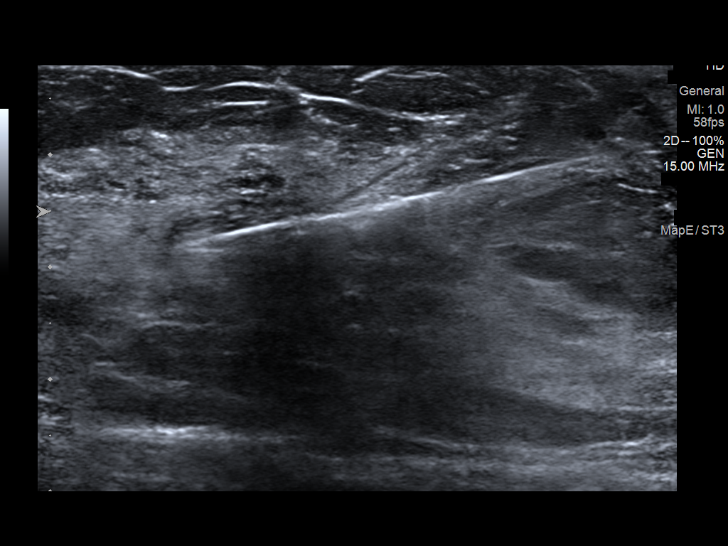
[im 5/11]
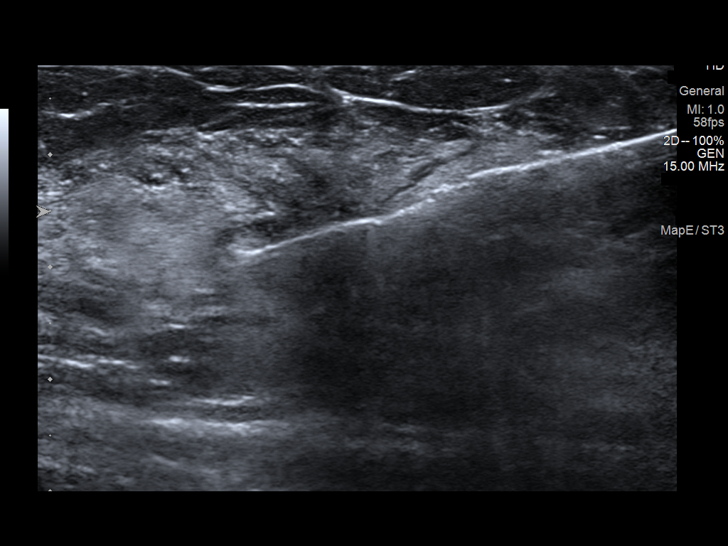
[im 6/11]
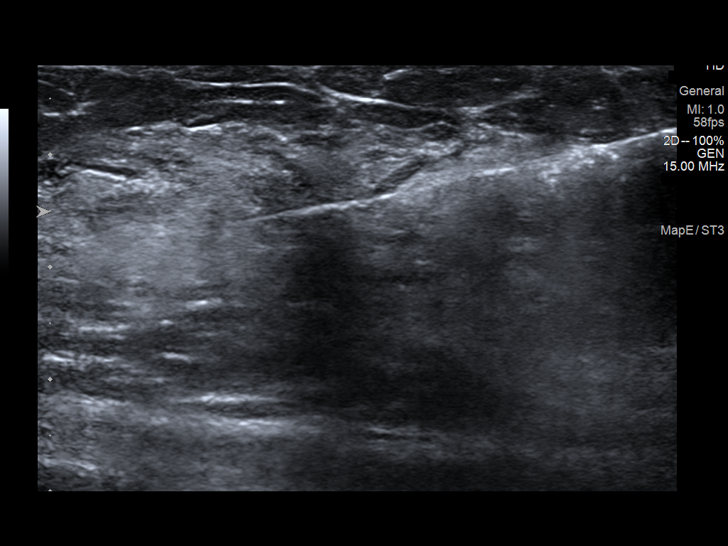
[im 7/11]
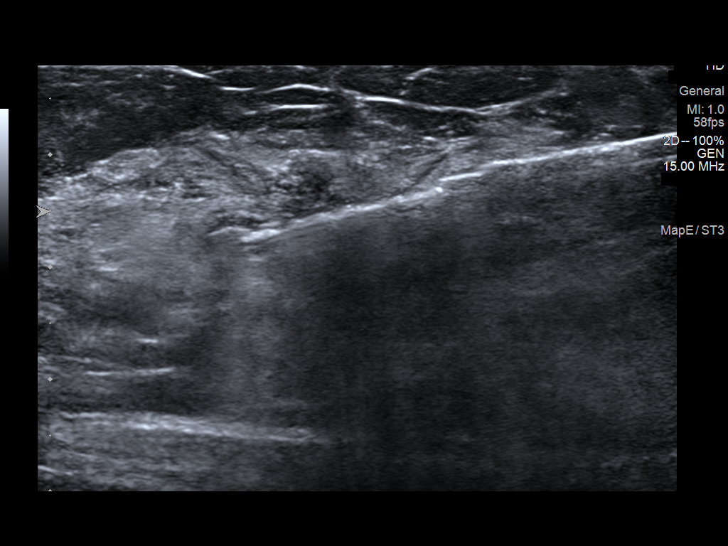
[im 8/11]
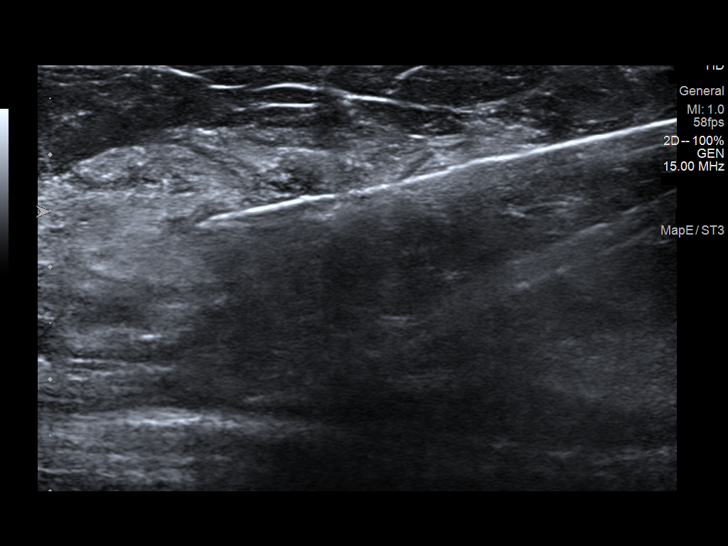
[im 9/11]
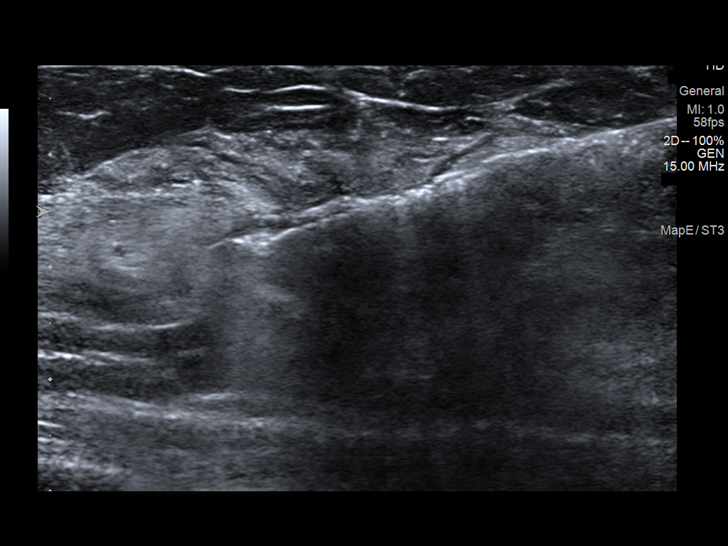
[im 10/11]
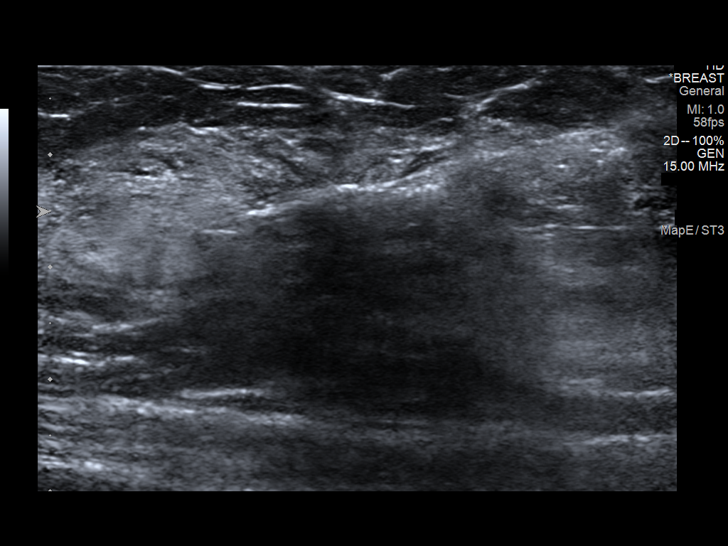
[im 11/11]
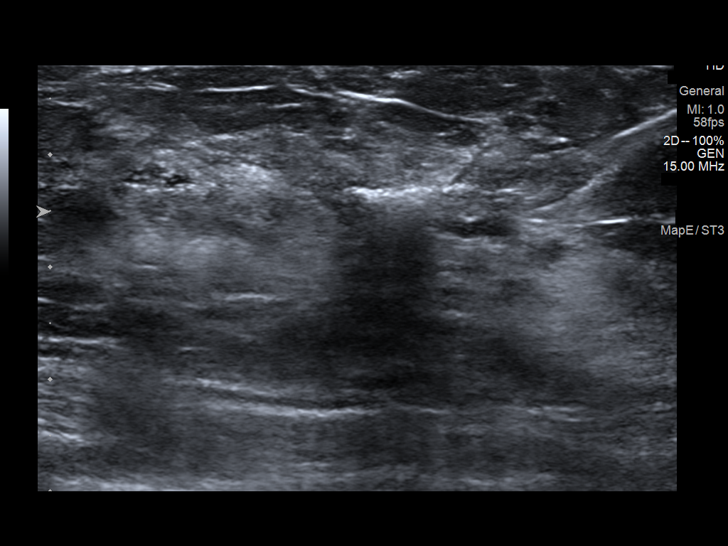

[11 of 11 positions shown; findings below may reference images not displayed]



Lesion quadrant: Upper outer quadrant

Using sterile technique and 1% Lidocaine as local anesthetic, under
direct ultrasound visualization, a 14 gauge Belmokhtar device was
used to perform biopsy of a mass in the right breast at 10 o'clock,
5 cm from the nipple using an inferior approach. At the conclusion
of the procedure a coil shaped tissue marker clip was deployed into
the biopsy cavity. Follow up 2 view mammogram was performed and
dictated separately.
IMPRESSION: Ultrasound guided biopsy of a right breast mass at 10 o'clock. No
apparent complications.

ADDENDUM:
Pathology revealed COMPLEX SCLEROSING LESION WITH USUAL DUCTAL
HYPERPLASIA, PAPILLOMA AND CALCIFICATIONS of the Right breast, 10:00
o'clock . This was found to be concordant by Dr. Bhnan Nayis,
with excision recommended.

Pathology results were discussed with the patient by telephone. The
patient reported doing well after the biopsy with tenderness at the
site. Post biopsy instructions and care were reviewed and questions
were answered. The patient was encouraged to call The [REDACTED]

Surgical consultation has been arranged with Dr. Etonam Tibil
at [REDACTED] on March 19, 2020.

Consider bilateral breast MRI prior to surgery given the patient's
age, heterogeneously dense breasts and family history of breast
cancer.

Pathology results reported by Mikiveca Ksee, RN on 03/16/2020.



Lesion quadrant: Upper outer quadrant

Using sterile technique and 1% Lidocaine as local anesthetic, under
direct ultrasound visualization, a 14 gauge Belmokhtar device was
used to perform biopsy of a mass in the right breast at 10 o'clock,
5 cm from the nipple using an inferior approach. At the conclusion
of the procedure a coil shaped tissue marker clip was deployed into
the biopsy cavity. Follow up 2 view mammogram was performed and
dictated separately.
IMPRESSION: Ultrasound guided biopsy of a right breast mass at 10 o'clock. No
apparent complications.

## 2021-07-13 ENCOUNTER — Encounter (HOSPITAL_COMMUNITY): Payer: Self-pay

## 2021-07-14 ENCOUNTER — Ambulatory Visit: Payer: BC Managed Care – PPO | Admitting: Family Medicine

## 2021-07-14 ENCOUNTER — Encounter: Payer: Self-pay | Admitting: Family Medicine

## 2021-07-14 VITALS — BP 130/80 | HR 74 | Ht 64.0 in | Wt 134.0 lb

## 2021-07-14 DIAGNOSIS — I10 Essential (primary) hypertension: Secondary | ICD-10-CM | POA: Diagnosis not present

## 2021-07-14 DIAGNOSIS — E782 Mixed hyperlipidemia: Secondary | ICD-10-CM

## 2021-07-14 DIAGNOSIS — F064 Anxiety disorder due to known physiological condition: Secondary | ICD-10-CM | POA: Diagnosis not present

## 2021-07-14 LAB — BASIC METABOLIC PANEL
BUN: 18 (ref 4–21)
Chloride: 101 (ref 99–108)
Creatinine: 1 (ref 0.5–1.1)
Glucose: 84
Potassium: 4.4 mEq/L (ref 3.5–5.1)
Sodium: 140 (ref 137–147)

## 2021-07-14 LAB — TSH: TSH: 1.87 (ref 0.41–5.90)

## 2021-07-14 LAB — HEPATIC FUNCTION PANEL
ALT: 17 U/L (ref 7–35)
AST: 18 (ref 13–35)
Alkaline Phosphatase: 51 (ref 25–125)
Bilirubin, Total: 0.8

## 2021-07-14 LAB — CBC: RBC: 4.9 (ref 3.87–5.11)

## 2021-07-14 LAB — LIPID PANEL
Cholesterol: 195 (ref 0–200)
HDL: 62 (ref 35–70)
LDL Cholesterol: 120
Triglycerides: 74 (ref 40–160)

## 2021-07-14 LAB — CBC AND DIFFERENTIAL
HCT: 45 (ref 36–46)
Hemoglobin: 15.2 (ref 12.0–16.0)
Platelets: 177 10*3/uL (ref 150–400)
WBC: 3

## 2021-07-14 LAB — COMPREHENSIVE METABOLIC PANEL: Calcium: 9.6 (ref 8.7–10.7)

## 2021-07-14 MED ORDER — ATORVASTATIN CALCIUM 10 MG PO TABS: ORAL_TABLET | ORAL | 1 refills | Status: DC

## 2021-07-14 MED ORDER — HYDROCHLOROTHIAZIDE 12.5 MG PO TABS: ORAL_TABLET | ORAL | 1 refills | Status: DC

## 2021-07-14 MED ORDER — SERTRALINE HCL 25 MG PO TABS: 25.0000 mg | ORAL_TABLET | Freq: Every day | ORAL | 1 refills | Status: DC

## 2021-07-14 NOTE — Progress Notes (Signed)
Date:  07/14/2021   Name:  CHIQUETTA LANGNER   DOB:  [birth year]   MRN:  [id number]   Chief Complaint: Depression, Hypertension, and Hyperlipidemia  Depression        This is a chronic problem.  The current episode started more than 1 year ago.   The onset quality is gradual.   The problem occurs intermittently.  The problem has been gradually improving since onset.  Associated symptoms include no decreased concentration, no fatigue, no helplessness, no hopelessness, does not have insomnia, not irritable, no restlessness, no decreased interest, no appetite change, no body aches, no myalgias, no headaches, no indigestion, not sad and no suicidal ideas.     The symptoms are aggravated by nothing.  Past treatments include SSRIs - Selective serotonin reuptake inhibitors.  Compliance with treatment is good.  Previous treatment provided mild relief.   Pertinent negatives include no hypothyroidism and no anxiety. Hypertension This is a chronic problem. The current episode started more than 1 year ago. The problem has been gradually improving since onset. The problem is controlled. Pertinent negatives include no anxiety, blurred vision, chest pain, headaches, malaise/fatigue, neck pain, orthopnea, palpitations, peripheral edema, PND, shortness of breath or sweats. There are no associated agents to hypertension. There are no known risk factors for coronary artery disease. Past treatments include diuretics. The current treatment provides moderate improvement. There are no compliance problems.  There is no history of angina, kidney disease, CAD/MI, CVA, heart failure, left ventricular hypertrophy, PVD or retinopathy. There is no history of chronic renal disease, a hypertension causing med or renovascular disease.  Hyperlipidemia This is a chronic problem. The current episode started more than 1 year ago. The problem is controlled. Recent lipid tests were reviewed and are normal. She has no history of chronic  renal disease, diabetes, hypothyroidism, liver disease, obesity or nephrotic syndrome. There are no known factors aggravating her hyperlipidemia. Pertinent negatives include no chest pain, focal sensory loss, focal weakness, leg pain, myalgias or shortness of breath. Current antihyperlipidemic treatment includes statins. The current treatment provides moderate improvement of lipids. There are no compliance problems.  Risk factors for coronary artery disease include dyslipidemia and hypertension.    Lab Results  Component Value Date   NA 139 01/22/2021   K 4.8 01/22/2021   CO2 25 01/22/2021   GLUCOSE 91 01/22/2021   BUN 15 01/22/2021   CREATININE 0.86 01/22/2021   CALCIUM 9.6 01/22/2021   EGFR 87 01/22/2021   GFRNONAA >60 04/07/2020   Lab Results  Component Value Date   CHOL 197 01/22/2021   HDL 67 01/22/2021   LDLCALC 119 (H) 01/22/2021   TRIG 61 01/22/2021   No results found for: "TSH" No results found for: "HGBA1C" Lab Results  Component Value Date   WBC 4.5 04/07/2020   HGB 15.9 (H) 04/07/2020   HCT 47.5 (H) 04/07/2020   MCV 90.8 04/07/2020   PLT 191 04/07/2020   Lab Results  Component Value Date   ALT 18 01/22/2021   AST 15 01/22/2021   ALKPHOS 68 01/22/2021   BILITOT 0.5 01/22/2021   No results found for: "25OHVITD2", "25OHVITD3", "VD25OH"   Review of Systems  Constitutional:  Negative for appetite change, fatigue and malaise/fatigue.  HENT:  Negative for sinus pressure and sinus pain.   Eyes:  Negative for blurred vision.  Respiratory:  Negative for cough and shortness of breath.   Cardiovascular:  Negative for chest pain, palpitations, orthopnea and PND.  Musculoskeletal:  Negative  for myalgias and neck pain.  Neurological:  Negative for focal weakness and headaches.  Psychiatric/Behavioral:  Positive for depression. Negative for decreased concentration and suicidal ideas. The patient does not have insomnia.     Patient Active Problem List   Diagnosis Date  Noted   History of cervical dysplasia 11/11/2016   Hypertension, postpartum condition or complication 90/24/0973   History of classical cesarean section 05/25/2016   History of cesarean section, classical 03/11/2016   Anxiety disorder due to known physiological condition 04/18/2014   Bacterial upper respiratory infection 04/18/2014    Allergies  Allergen Reactions   Sulfa Antibiotics Hives    As a child    Past Surgical History:  Procedure Laterality Date   BREAST SURGERY     BUNIONECTOMY     CESAREAN SECTION  2013   CESAREAN SECTION WITH BILATERAL TUBAL LIGATION Bilateral 05/25/2016   Procedure: REPEAT CESAREAN SECTION WITH BILATERAL TUBAL LIGATION;  Surgeon: Gae Dry, MD;  Location: [facility];  Service: Obstetrics;  Laterality: Bilateral;  Female @ 1550 Apgars: 8/9 Weight: 7lb  2oz   LAPAROSCOPY  2007   RADIOACTIVE SEED GUIDED EXCISIONAL BREAST BIOPSY Right 04/10/2020   Procedure: RADIOACTIVE SEED GUIDED EXCISIONAL RIGHT BREAST BIOPSY;  Surgeon: Rolm Bookbinder, MD;  Location: [place];  Service: General;  Laterality: Right;    Social History   Tobacco Use   Smoking status: Never   Smokeless tobacco: Never  Vaping Use   Vaping Use: Never used  Substance Use Topics   Alcohol use: Yes    Alcohol/week: 0.0 standard drinks of alcohol    Comment: occasional social drinker   Drug use: No     Medication list has been reviewed and updated.  Current Meds  Medication Sig   atorvastatin (LIPITOR) 10 MG tablet TAKE 1 TABLET(10 MG) BY MOUTH DAILY   cetirizine (ZYRTEC) 10 MG tablet Take 10 mg by mouth daily as needed for allergies.   hydrochlorothiazide (HYDRODIURIL) 12.5 MG tablet Take 1 tablet daily   sertraline (ZOLOFT) 25 MG tablet Take 1 tablet (25 mg total) by mouth daily.       07/14/2021    8:43 AM 01/22/2021    9:07 AM 07/10/2020    9:25 AM 01/10/2020    9:05 AM  GAD 7 : Generalized Anxiety Score  Nervous, Anxious, on Edge 0 0 0 0  Control/stop worrying 0 0  0 0  Worry too much - different things 1 0 0 0  Trouble relaxing 0 0 0 0  Restless 0 0 0 0  Easily annoyed or irritable 1 0 0 0  Afraid - awful might happen 1 0 0 0  Total GAD 7 Score 3 0 0 0  Anxiety Difficulty Not difficult at all          07/14/2021    8:43 AM 01/22/2021    9:07 AM 07/10/2020    9:25 AM  Depression screen PHQ 2/9  Decreased Interest 0 0 0  Down, Depressed, Hopeless 0 0 0  PHQ - 2 Score 0 0 0  Altered sleeping 1 0 0  Tired, decreased energy 0 0 0  Change in appetite 0 0 0  Feeling bad or failure about yourself  0 0 0  Trouble concentrating 0 0 0  Moving slowly or fidgety/restless 0 0 0  Suicidal thoughts 0 0 0  PHQ-9 Score 1 0 0  Difficult doing work/chores Not difficult at all Not difficult at all     BP Readings  from Last 3 Encounters:  07/14/21 130/80  01/22/21 118/88  12/25/20 122/70    Physical Exam Vitals and nursing note reviewed. Exam conducted with a chaperone present.  Constitutional:      General: She is not irritable.She is not in acute distress.    Appearance: She is not diaphoretic.  HENT:     Head: Normocephalic and atraumatic.     Right Ear: External ear normal.     Left Ear: External ear normal.     Nose: Nose normal.  Eyes:     General:        Right eye: No discharge.        Left eye: No discharge.     Conjunctiva/sclera: Conjunctivae normal.     Pupils: Pupils are equal, round, and reactive to light.  Neck:     Thyroid: No thyromegaly.     Vascular: No JVD.  Cardiovascular:     Rate and Rhythm: Normal rate and regular rhythm.     Heart sounds: Normal heart sounds. No murmur heard.    No friction rub. No gallop.  Pulmonary:     Effort: Pulmonary effort is normal.     Breath sounds: Normal breath sounds.  Abdominal:     General: Bowel sounds are normal.     Palpations: Abdomen is soft. There is no mass.     Tenderness: There is no abdominal tenderness. There is no guarding.  Musculoskeletal:        General: Normal  range of motion.     Cervical back: Normal range of motion and neck supple.  Lymphadenopathy:     Cervical: No cervical adenopathy.  Skin:    General: Skin is warm and dry.  Neurological:     Mental Status: She is alert.     Wt Readings from Last 3 Encounters:  07/14/21 134 lb (60.8 kg)  01/22/21 131 lb (59.4 kg)  12/25/20 130 lb (59 kg)    BP 130/80   Pulse 74   Ht 5' 4"  (1.626 m)   Wt 134 lb (60.8 kg)   LMP 06/23/2021 (Exact Date)   BMI 23.00 kg/m   Assessment and Plan:  1. Essential hypertension Chronic.  Controlled.  Stable.  Blood pressure 130/80.  Continue hydrochlorothiazide 12.5 mg once a day.  We will recheck in 6 months. - hydrochlorothiazide (HYDRODIURIL) 12.5 MG tablet; Take 1 tablet daily  Dispense: 90 tablet; Refill: 1  2. Mixed hyperlipidemia Chronic.  Controlled.  Stable.  Continue atorvastatin 10 mg once a day.  We will recheck in 6 months. - atorvastatin (LIPITOR) 10 MG tablet; TAKE 1 TABLET(10 MG) BY MOUTH DAILY  Dispense: 90 tablet; Refill: 1  3. Anxiety disorder due to known physiological condition Chronic.  Controlled.  Stable.  GAD score is 1.  Continue sertraline 25 mg once a day.  Patient prefers to stay on it at this time and really likes the control. - sertraline (ZOLOFT) 25 MG tablet; Take 1 tablet (25 mg total) by mouth daily.  Dispense: 90 tablet; Refill: 1

## 2021-07-15 ENCOUNTER — Encounter: Payer: Self-pay | Admitting: Family Medicine

## 2021-08-05 IMAGING — MG MM BREAST SURGICAL SPECIMEN
2 series · 2 of 2 positions shown · non-contrast
Comparison: Previous exam(s).

CLINICAL DATA: Status post surgical excision today after earlier
radioactive seed localization.

EXAM:
SPECIMEN RADIOGRAPH OF THE RIGHT BREAST

[R (1 of 2)]
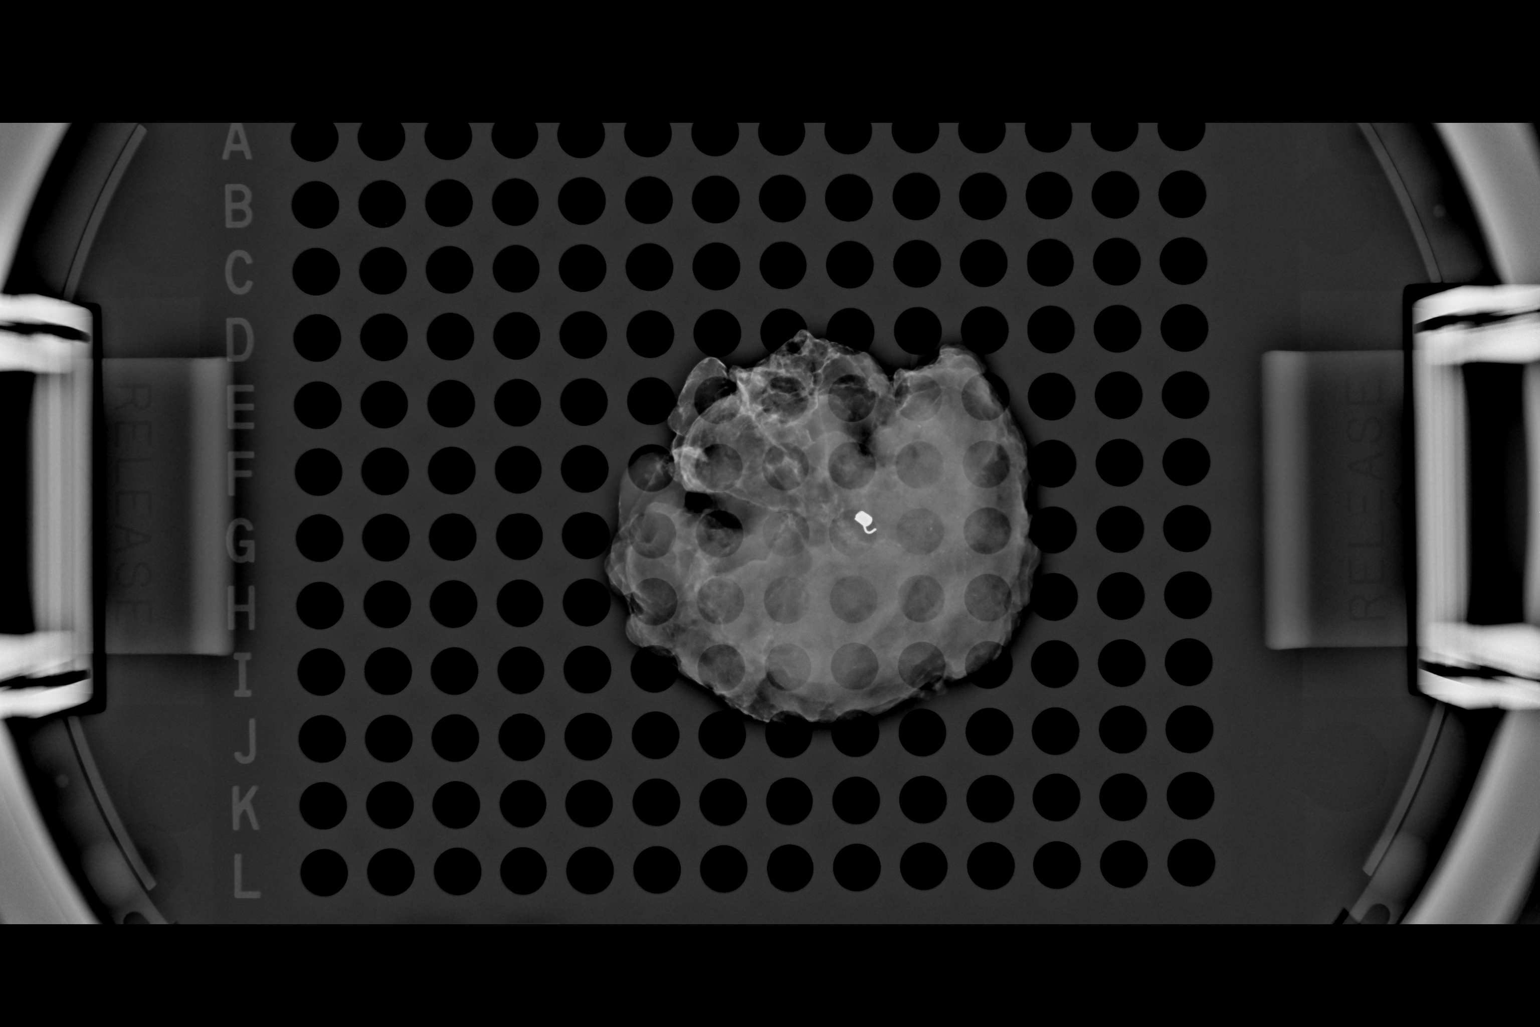

[R (2 of 2)]
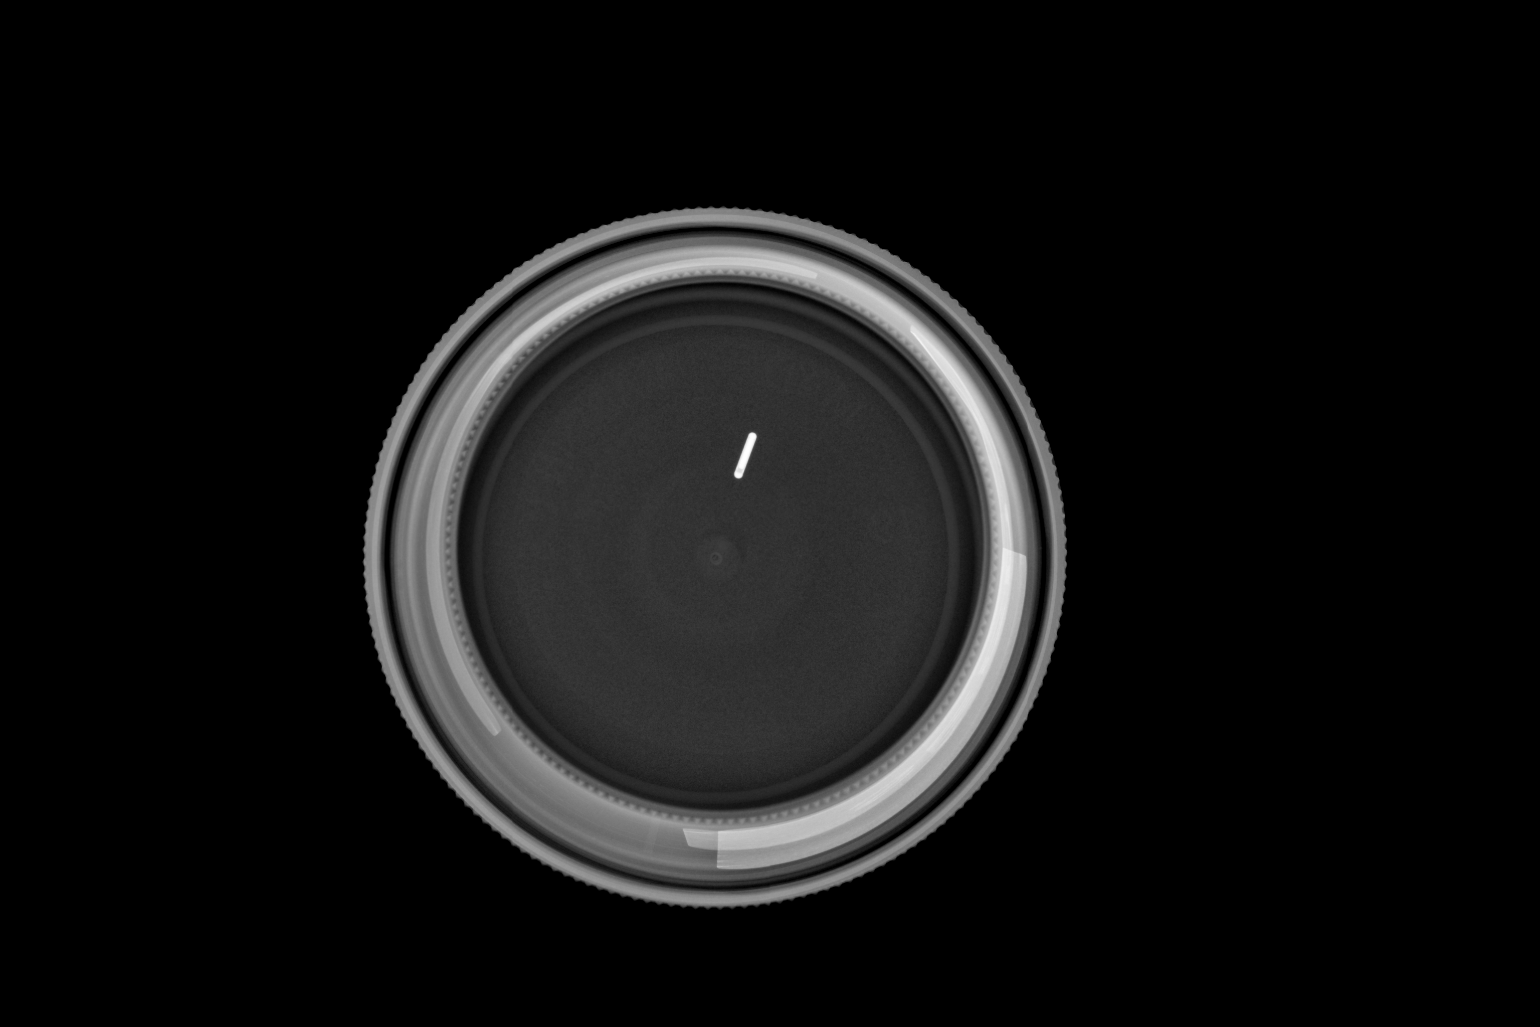

[2 of 2 positions shown; findings below may reference images not displayed]

FINDINGS: Status post excision of the right breast. The biopsy marker clip is
present within specimen, intact and marked for pathology. The
radioactive seed is shown within a separate container. Findings
discussed with the OR staff during the procedure
IMPRESSION: Specimen radiograph of the right breast.

## 2021-08-27 ENCOUNTER — Other Ambulatory Visit: Payer: BC Managed Care – PPO

## 2021-08-27 DIAGNOSIS — D72829 Elevated white blood cell count, unspecified: Secondary | ICD-10-CM

## 2021-08-27 DIAGNOSIS — E782 Mixed hyperlipidemia: Secondary | ICD-10-CM | POA: Diagnosis not present

## 2021-08-28 LAB — LIPID PANEL
Chol/HDL Ratio: 3.2 ratio (ref 0.0–4.4)
Cholesterol, Total: 202 mg/dL — ABNORMAL HIGH (ref 100–199)
HDL: 63 mg/dL (ref >39)
LDL Chol Calc (NIH): 120 mg/dL — ABNORMAL HIGH (ref 0–99)
Triglycerides: 109 mg/dL (ref 0–149)
VLDL Cholesterol Cal: 19 mg/dL (ref 5–40)

## 2021-08-28 LAB — CBC WITH DIFFERENTIAL/PLATELET
Basophils Absolute: 0 10*3/uL (ref 0.0–0.2)
Basos: 1 %
EOS (ABSOLUTE): 0.1 10*3/uL (ref 0.0–0.4)
Eos: 3 %
Hematocrit: 47.9 % — ABNORMAL HIGH (ref 34.0–46.6)
Hemoglobin: 16.2 g/dL — ABNORMAL HIGH (ref 11.1–15.9)
Immature Grans (Abs): 0 10*3/uL (ref 0.0–0.1)
Immature Granulocytes: 0 %
Lymphocytes Absolute: 1 10*3/uL (ref 0.7–3.1)
Lymphs: 28 %
MCH: 31.5 pg (ref 26.6–33.0)
MCHC: 33.8 g/dL (ref 31.5–35.7)
MCV: 93 fL (ref 79–97)
Monocytes Absolute: 0.3 10*3/uL (ref 0.1–0.9)
Monocytes: 8 %
Neutrophils Absolute: 2.1 10*3/uL (ref 1.4–7.0)
Neutrophils: 60 %
Platelets: 198 10*3/uL (ref 150–450)
RBC: 5.15 x10E6/uL (ref 3.77–5.28)
RDW: 11.9 % (ref 11.7–15.4)
WBC: 3.5 10*3/uL (ref 3.4–10.8)

## 2021-08-30 ENCOUNTER — Other Ambulatory Visit: Payer: Self-pay

## 2021-08-30 DIAGNOSIS — E782 Mixed hyperlipidemia: Secondary | ICD-10-CM

## 2021-08-30 MED ORDER — ATORVASTATIN CALCIUM 20 MG PO TABS: ORAL_TABLET | ORAL | 1 refills | Status: DC

## 2021-09-23 ENCOUNTER — Other Ambulatory Visit: Payer: Self-pay | Admitting: Obstetrics & Gynecology

## 2021-09-23 DIAGNOSIS — Z1231 Encounter for screening mammogram for malignant neoplasm of breast: Secondary | ICD-10-CM

## 2021-10-18 ENCOUNTER — Other Ambulatory Visit: Payer: Self-pay | Admitting: Family Medicine

## 2021-10-18 DIAGNOSIS — E782 Mixed hyperlipidemia: Secondary | ICD-10-CM

## 2021-10-18 NOTE — Telephone Encounter (Unsigned)
Copied from [place] [id number]. Topic: General - Other >> Oct 18, 2021 12:06 PM Everette C wrote: Reason for CRM: Medication Refill - Medication: atorvastatin (LIPITOR) 20 MG tablet [[id number]]   Has the patient contacted their pharmacy? No. The patient has been previously directed to contact the practice  (Agent: If no, request that the patient contact the pharmacy for the refill. If patient does not wish to contact the pharmacy document the reason why and proceed with request.) (Agent: If yes, when and what did the pharmacy advise?)  Preferred Pharmacy (with phone number or street name): [facility], [address] [place] [address] Phone: [phone] Fax: [phone] Hours: Not open 24 hours   Has the patient been seen for an appointment in the last year OR does the patient have an upcoming appointment? Yes.    Agent: Please be advised that RX refills may take up to 3 business days. We ask that you follow-up with your pharmacy.

## 2021-10-19 MED ORDER — ATORVASTATIN CALCIUM 20 MG PO TABS: ORAL_TABLET | ORAL | 1 refills | Status: DC

## 2021-10-19 NOTE — Telephone Encounter (Signed)
Requested Prescriptions  Pending Prescriptions Disp Refills  . atorvastatin (LIPITOR) 20 MG tablet 90 tablet 1    Sig: TAKE 1 TABLET(10 MG) BY MOUTH DAILY     Cardiovascular:  Antilipid - Statins Failed - 10/18/2021 12:53 PM      Failed - Lipid Panel in normal range within the last 12 months    Cholesterol, Total  Date Value Ref Range Status  08/27/2021 202 (H) 100 - 199 mg/dL Final   LDL Chol Calc (NIH)  Date Value Ref Range Status  08/27/2021 120 (H) 0 - 99 mg/dL Final   HDL  Date Value Ref Range Status  08/27/2021 63 >39 mg/dL Final   Triglycerides  Date Value Ref Range Status  08/27/2021 109 0 - 149 mg/dL Final         Passed - Patient is not pregnant      Passed - Valid encounter within last 12 months    Recent Outpatient Visits          3 months ago Essential hypertension   [facility] [place], Deanna C, MD   9 months ago Pharyngitis, unspecified etiology   [place] [facility] [place], Deanna C, MD   1 year ago Essential hypertension   [place] [facility] [place], Deanna C, MD   1 year ago Essential hypertension   [place] [facility] [place], Deanna C, MD   2 years ago Essential hypertension   [place] [facility] [place], [place], MD      Future Appointments            In 2 months Juline Patch, MD [facility] at [facility], [facility]

## 2021-10-20 ENCOUNTER — Ambulatory Visit: Payer: Self-pay

## 2021-12-07 ENCOUNTER — Other Ambulatory Visit: Payer: Self-pay

## 2021-12-07 ENCOUNTER — Telehealth: Payer: Self-pay | Admitting: Family Medicine

## 2021-12-07 DIAGNOSIS — E782 Mixed hyperlipidemia: Secondary | ICD-10-CM

## 2021-12-07 MED ORDER — ATORVASTATIN CALCIUM 20 MG PO TABS: 20.0000 mg | ORAL_TABLET | Freq: Every day | ORAL | 1 refills | Status: DC

## 2021-12-07 NOTE — Telephone Encounter (Signed)
Copied from [place] [id number]. Topic: General - Other >> Dec 07, 2021 12:57 PM Everette C wrote: Reason for CRM: The patient would like to speak directly with T Lynch when possible regarding their atorvastatin (LIPITOR) 20 MG tablet [[id number]] prescription   Please contact further when available

## 2021-12-10 ENCOUNTER — Ambulatory Visit
Admission: RE | Admit: 2021-12-10 | Discharge: 2021-12-10 | Disposition: A | Discharge status: Home / Self Care | Payer: BC Managed Care – PPO | Source: Ambulatory Visit | Attending: Obstetrics & Gynecology | Admitting: Obstetrics & Gynecology

## 2021-12-10 DIAGNOSIS — Z1231 Encounter for screening mammogram for malignant neoplasm of breast: Secondary | ICD-10-CM | POA: Diagnosis not present

## 2021-12-20 DIAGNOSIS — Z6824 Body mass index (BMI) 24.0-24.9, adult: Secondary | ICD-10-CM | POA: Diagnosis not present

## 2021-12-20 DIAGNOSIS — Z124 Encounter for screening for malignant neoplasm of cervix: Secondary | ICD-10-CM | POA: Diagnosis not present

## 2021-12-20 DIAGNOSIS — Z01419 Encounter for gynecological examination (general) (routine) without abnormal findings: Secondary | ICD-10-CM | POA: Diagnosis not present

## 2022-01-14 ENCOUNTER — Ambulatory Visit: Payer: No Typology Code available for payment source | Admitting: Family Medicine

## 2022-01-14 ENCOUNTER — Encounter: Payer: Self-pay | Admitting: Family Medicine

## 2022-01-14 VITALS — BP 126/100 | HR 76 | Ht 64.0 in | Wt 141.0 lb

## 2022-01-14 DIAGNOSIS — F064 Anxiety disorder due to known physiological condition: Secondary | ICD-10-CM

## 2022-01-14 DIAGNOSIS — I10 Essential (primary) hypertension: Secondary | ICD-10-CM | POA: Diagnosis not present

## 2022-01-14 DIAGNOSIS — E782 Mixed hyperlipidemia: Secondary | ICD-10-CM | POA: Diagnosis not present

## 2022-01-14 DIAGNOSIS — Z862 Personal history of diseases of the blood and blood-forming organs and certain disorders involving the immune mechanism: Secondary | ICD-10-CM

## 2022-01-14 MED ORDER — ATORVASTATIN CALCIUM 20 MG PO TABS: 20.0000 mg | ORAL_TABLET | Freq: Every day | ORAL | 1 refills | Status: DC

## 2022-01-14 MED ORDER — HYDROCHLOROTHIAZIDE 12.5 MG PO TABS: ORAL_TABLET | ORAL | 1 refills | Status: DC

## 2022-01-14 MED ORDER — LISINOPRIL 10 MG PO TABS: 10.0000 mg | ORAL_TABLET | Freq: Every day | ORAL | 1 refills | Status: DC

## 2022-01-14 MED ORDER — SERTRALINE HCL 25 MG PO TABS: 25.0000 mg | ORAL_TABLET | Freq: Every day | ORAL | 1 refills | Status: DC

## 2022-01-14 NOTE — Progress Notes (Signed)
Date:  01/14/2022   Name:  Denise Clarke   DOB:  [birth year]   MRN:  [id number]   Chief Complaint: Hyperlipidemia, Hypertension, and Depression  Hyperlipidemia This is a new problem. The current episode started more than 1 year ago. The problem is controlled. Recent lipid tests were reviewed and are normal. She has no history of chronic renal disease, diabetes, hypothyroidism, liver disease, obesity or nephrotic syndrome. There are no known factors aggravating her hyperlipidemia. Associated symptoms include shortness of breath. Pertinent negatives include no chest pain, focal sensory loss, focal weakness or myalgias. Current antihyperlipidemic treatment includes statins. The current treatment provides mild improvement of lipids. There are no compliance problems.  Risk factors for coronary artery disease include dyslipidemia and hypertension.  Hypertension This is a chronic problem. The current episode started more than 1 year ago. The problem has been waxing and waning since onset. The problem is controlled. Associated symptoms include shortness of breath. Pertinent negatives include no blurred vision, chest pain, headaches, orthopnea, palpitations or PND. There are no associated agents to hypertension. Risk factors for coronary artery disease include dyslipidemia. The current treatment provides mild improvement. There are no compliance problems.  There is no history of angina, kidney disease, CAD/MI, CVA, heart failure, left ventricular hypertrophy, PVD or retinopathy. There is no history of chronic renal disease, a hypertension causing med or renovascular disease.  Depression        The problem has been gradually improving since onset.  Associated symptoms include no decreased concentration, no fatigue, no helplessness, no hopelessness, does not have insomnia, not irritable, no restlessness, no decreased interest, no appetite change, no body aches, no myalgias, no headaches, no indigestion, not  sad and no suicidal ideas.  Past treatments include SSRIs - Selective serotonin reuptake inhibitors.   Pertinent negatives include no hypothyroidism.   Lab Results  Component Value Date   NA 140 07/14/2021   K 4.4 07/14/2021   CO2 25 01/22/2021   GLUCOSE 91 01/22/2021   BUN 18 07/14/2021   CREATININE 1.0 07/14/2021   CALCIUM 9.6 07/14/2021   EGFR 87 01/22/2021   GFRNONAA >60 04/07/2020   Lab Results  Component Value Date   CHOL 202 (H) 08/27/2021   HDL 63 08/27/2021   LDLCALC 120 (H) 08/27/2021   TRIG 109 08/27/2021   CHOLHDL 3.2 08/27/2021   Lab Results  Component Value Date   TSH 1.87 07/14/2021   No results found for: "HGBA1C" Lab Results  Component Value Date   WBC 3.5 08/27/2021   HGB 16.2 (H) 08/27/2021   HCT 47.9 (H) 08/27/2021   MCV 93 08/27/2021   PLT 198 08/27/2021   Lab Results  Component Value Date   ALT 17 07/14/2021   AST 18 07/14/2021   ALKPHOS 51 07/14/2021   BILITOT 0.5 01/22/2021   No results found for: "25OHVITD2", "25OHVITD3", "VD25OH"   Review of Systems  Constitutional: Negative.  Negative for appetite change, chills, fatigue, fever and unexpected weight change.  HENT:  Negative for congestion, ear discharge, ear pain, rhinorrhea, sinus pressure, sneezing and sore throat.   Eyes:  Negative for blurred vision.  Respiratory:  Positive for shortness of breath. Negative for cough, wheezing and stridor.   Cardiovascular:  Negative for chest pain, palpitations, orthopnea and PND.  Gastrointestinal:  Negative for abdominal pain, blood in stool, constipation, diarrhea and nausea.  Genitourinary:  Negative for dysuria, flank pain, frequency, hematuria, urgency and vaginal discharge.  Musculoskeletal:  Negative for arthralgias, back pain  and myalgias.  Skin:  Negative for rash.  Neurological:  Negative for dizziness, focal weakness, weakness and headaches.  Hematological:  Negative for adenopathy. Does not bruise/bleed easily.   Psychiatric/Behavioral:  Positive for depression. Negative for decreased concentration, dysphoric mood and suicidal ideas. The patient is not nervous/anxious and does not have insomnia.     Patient Active Problem List   Diagnosis Date Noted   History of cervical dysplasia 11/11/2016   Hypertension, postpartum condition or complication 81/19/1478   History of classical cesarean section 05/25/2016   History of cesarean section, classical 03/11/2016   Anxiety disorder due to known physiological condition 04/18/2014   Bacterial upper respiratory infection 04/18/2014    Allergies  Allergen Reactions   Sulfa Antibiotics Hives    As a child    Past Surgical History:  Procedure Laterality Date   BREAST SURGERY     BUNIONECTOMY     CESAREAN SECTION  2013   CESAREAN SECTION WITH BILATERAL TUBAL LIGATION Bilateral 05/25/2016   Procedure: REPEAT CESAREAN SECTION WITH BILATERAL TUBAL LIGATION;  Surgeon: Gae Dry, MD;  Location: [facility];  Service: Obstetrics;  Laterality: Bilateral;  Female @ 1550 Apgars: 8/9 Weight: 7lb  2oz   LAPAROSCOPY  2007   RADIOACTIVE SEED GUIDED EXCISIONAL BREAST BIOPSY Right 04/10/2020   Procedure: RADIOACTIVE SEED GUIDED EXCISIONAL RIGHT BREAST BIOPSY;  Surgeon: Rolm Bookbinder, MD;  Location: [facility];  Service: General;  Laterality: Right;    Social History   Tobacco Use   Smoking status: Never   Smokeless tobacco: Never  Vaping Use   Vaping Use: Never used  Substance Use Topics   Alcohol use: Yes    Alcohol/week: 0.0 standard drinks of alcohol    Comment: occasional social drinker   Drug use: No     Medication list has been reviewed and updated.  Current Meds  Medication Sig   atorvastatin (LIPITOR) 20 MG tablet Take 1 tablet (20 mg total) by mouth daily. TAKE 1 TABLET(10 MG) BY MOUTH DAILY   cetirizine (ZYRTEC) 10 MG tablet Take 10 mg by mouth daily as needed for allergies.   hydrochlorothiazide (HYDRODIURIL) 12.5 MG tablet Take 1  tablet daily   sertraline (ZOLOFT) 25 MG tablet Take 1 tablet (25 mg total) by mouth daily.       01/14/2022    9:21 AM 07/14/2021    8:43 AM 01/22/2021    9:07 AM 07/10/2020    9:25 AM  GAD 7 : Generalized Anxiety Score  Nervous, Anxious, on Edge 0 0 0 0  Control/stop worrying 0 0 0 0  Worry too much - different things 0 1 0 0  Trouble relaxing 0 0 0 0  Restless 0 0 0 0  Easily annoyed or irritable 0 1 0 0  Afraid - awful might happen 0 1 0 0  Total GAD 7 Score 0 3 0 0  Anxiety Difficulty Not difficult at all Not difficult at all         01/14/2022    9:21 AM 07/14/2021    8:43 AM 01/22/2021    9:07 AM  Depression screen PHQ 2/9  Decreased Interest 0 0 0  Down, Depressed, Hopeless 0 0 0  PHQ - 2 Score 0 0 0  Altered sleeping 0 1 0  Tired, decreased energy 0 0 0  Change in appetite 0 0 0  Feeling bad or failure about yourself  0 0 0  Trouble concentrating 0 0 0  Moving slowly or fidgety/restless  0 0 0  Suicidal thoughts 0 0 0  PHQ-9 Score 0 1 0  Difficult doing work/chores Not difficult at all Not difficult at all Not difficult at all    BP Readings from Last 3 Encounters:  01/14/22 (!) 126/100  07/14/21 130/80  01/22/21 118/88    Physical Exam Constitutional:      General: She is not irritable. HENT:     Head: Normocephalic and atraumatic.     Right Ear: Tympanic membrane, ear canal and external ear normal.     Left Ear: Tympanic membrane, ear canal and external ear normal.     Nose: Nose normal. No congestion or rhinorrhea.     Mouth/Throat:     Mouth: Mucous membranes are moist.     Pharynx: No oropharyngeal exudate or posterior oropharyngeal erythema.  Eyes:     Pupils: Pupils are equal, round, and reactive to light.  Cardiovascular:     Rate and Rhythm: Normal rate.     Heart sounds: Normal heart sounds, S1 normal and S2 normal. No murmur heard.    No systolic murmur is present.     No diastolic murmur is present.     No friction rub. No gallop. No S3 or  S4 sounds.  Pulmonary:     Breath sounds: No wheezing, rhonchi or rales.  Abdominal:     Tenderness: There is no abdominal tenderness. There is no guarding.     Hernia: No hernia is present.  Musculoskeletal:     Cervical back: Normal range of motion.  Skin:    General: Skin is warm.  Neurological:     Mental Status: She is alert.     Wt Readings from Last 3 Encounters:  01/14/22 141 lb (64 kg)  07/14/21 134 lb (60.8 kg)  01/22/21 131 lb (59.4 kg)    BP (!) 126/100 (BP Location: Right Arm, Cuff Size: Normal)   Pulse 76   Ht '5\' 4"'$  (1.626 m)   Wt 141 lb (64 kg)   LMP 12/30/2021 (Exact Date)   SpO2 99%   BMI 24.20 kg/m   Assessment and Plan:  1. Essential hypertension Chronic.  Uncontrolled.  Stable.  Diastolic remains elevated.  100.  In addition to hydrochlorothiazide 12.5 mg we will add lisinopril 10 mg once a day.  Will check comprehensive metabolic panel for electrolytes and GFR.  Will recheck in 12 weeks dosing on dual therapy. - hydrochlorothiazide (HYDRODIURIL) 12.5 MG tablet; Take 1 tablet daily  Dispense: 90 tablet; Refill: 1 - Comprehensive Metabolic Panel (CMET) - lisinopril (ZESTRIL) 10 MG tablet; Take 1 tablet (10 mg total) by mouth daily.  Dispense: 90 tablet; Refill: 1  2. Mixed hyperlipidemia Chronic.  Controlled.  Stable.  Continue atorvastatin 20 mg once a day. - atorvastatin (LIPITOR) 20 MG tablet; Take 1 tablet (20 mg total) by mouth daily. TAKE 1 TABLET(10 MG) BY MOUTH DAILY  Dispense: 90 tablet; Refill: 1 - Lipid Panel With LDL/HDL Ratio  3. Anxiety disorder due to known physiological condition Chronic.  Controlled.  Stable.  GAD score is 0.  PHQ score is 0.  Continue sertraline 25 mg daily. - sertraline (ZOLOFT) 25 MG tablet; Take 1 tablet (25 mg total) by mouth daily.  Dispense: 90 tablet; Refill: 1  4. History of anemia Previous history of anemia most likely from labor and delivery however last hemoglobin was slightly elevated most likely from  dehydration but we will repeat to make certain that we do not have a hemochromatosis concern. -  CBC w/Diff/Platelet    Otilio Miu, MD

## 2022-01-15 LAB — LIPID PANEL WITH LDL/HDL RATIO
Cholesterol, Total: 161 mg/dL (ref 100–199)
HDL: 57 mg/dL (ref >39)
LDL Chol Calc (NIH): 91 mg/dL (ref 0–99)
LDL/HDL Ratio: 1.6 ratio (ref 0.0–3.2)
Triglycerides: 64 mg/dL (ref 0–149)
VLDL Cholesterol Cal: 13 mg/dL (ref 5–40)

## 2022-01-15 LAB — COMPREHENSIVE METABOLIC PANEL
ALT: 20 IU/L (ref 0–32)
AST: 19 IU/L (ref 0–40)
Albumin/Globulin Ratio: 2.2 (ref 1.2–2.2)
Albumin: 4.8 g/dL (ref 3.9–4.9)
Alkaline Phosphatase: 58 IU/L (ref 44–121)
BUN/Creatinine Ratio: 18 (ref 9–23)
BUN: 16 mg/dL (ref 6–24)
Bilirubin Total: 0.6 mg/dL (ref 0.0–1.2)
CO2: 24 mmol/L (ref 20–29)
Calcium: 9.5 mg/dL (ref 8.7–10.2)
Chloride: 100 mmol/L (ref 96–106)
Creatinine, Ser: 0.87 mg/dL (ref 0.57–1.00)
Globulin, Total: 2.2 g/dL (ref 1.5–4.5)
Glucose: 94 mg/dL (ref 70–99)
Potassium: 4.7 mmol/L (ref 3.5–5.2)
Sodium: 139 mmol/L (ref 134–144)
Total Protein: 7 g/dL (ref 6.0–8.5)
eGFR: 85 mL/min/{1.73_m2} (ref >59)

## 2022-01-15 LAB — CBC WITH DIFFERENTIAL/PLATELET
Basophils Absolute: 0 10*3/uL (ref 0.0–0.2)
Basos: 1 %
EOS (ABSOLUTE): 0.1 10*3/uL (ref 0.0–0.4)
Eos: 3 %
Hematocrit: 45.9 % (ref 34.0–46.6)
Hemoglobin: 15.5 g/dL (ref 11.1–15.9)
Immature Grans (Abs): 0 10*3/uL (ref 0.0–0.1)
Immature Granulocytes: 0 %
Lymphocytes Absolute: 0.9 10*3/uL (ref 0.7–3.1)
Lymphs: 23 %
MCH: 31.2 pg (ref 26.6–33.0)
MCHC: 33.8 g/dL (ref 31.5–35.7)
MCV: 92 fL (ref 79–97)
Monocytes Absolute: 0.3 10*3/uL (ref 0.1–0.9)
Monocytes: 9 %
Neutrophils Absolute: 2.5 10*3/uL (ref 1.4–7.0)
Neutrophils: 64 %
Platelets: 211 10*3/uL (ref 150–450)
RBC: 4.97 x10E6/uL (ref 3.77–5.28)
RDW: 12.4 % (ref 11.7–15.4)
WBC: 3.9 10*3/uL (ref 3.4–10.8)

## 2022-04-08 ENCOUNTER — Encounter: Payer: Self-pay | Admitting: Family Medicine

## 2022-04-08 ENCOUNTER — Ambulatory Visit: Payer: No Typology Code available for payment source | Admitting: Family Medicine

## 2022-04-08 VITALS — BP 120/74 | HR 70 | Ht 64.0 in | Wt 142.0 lb

## 2022-04-08 DIAGNOSIS — I10 Essential (primary) hypertension: Secondary | ICD-10-CM

## 2022-04-08 MED ORDER — HYDROCHLOROTHIAZIDE 12.5 MG PO TABS: ORAL_TABLET | ORAL | 1 refills | Status: DC

## 2022-04-08 MED ORDER — LISINOPRIL 5 MG PO TABS: 5.0000 mg | ORAL_TABLET | Freq: Every day | ORAL | 1 refills | Status: DC

## 2022-04-08 NOTE — Progress Notes (Signed)
Date:  04/08/2022   Name:  Denise Clarke   DOB:  1980-02-13   MRN:  [id number]   Chief Complaint: Hypertension  Hypertension This is a chronic problem. The current episode started more than 1 year ago. The problem has been gradually improving since onset. The problem is controlled. Pertinent negatives include no blurred vision, chest pain, headaches, orthopnea, palpitations, peripheral edema, PND or shortness of breath. There are no associated agents to hypertension. Past treatments include ACE inhibitors and diuretics. The current treatment provides mild improvement. There are no compliance problems.  There is no history of CAD/MI or CVA. There is no history of chronic renal disease, a hypertension causing med or renovascular disease.    Lab Results  Component Value Date   NA 139 01/14/2022   K 4.7 01/14/2022   CO2 24 01/14/2022   GLUCOSE 94 01/14/2022   BUN 16 01/14/2022   CREATININE 0.87 01/14/2022   CALCIUM 9.5 01/14/2022   EGFR 85 01/14/2022   GFRNONAA >60 04/07/2020   Lab Results  Component Value Date   CHOL 161 01/14/2022   HDL 57 01/14/2022   LDLCALC 91 01/14/2022   TRIG 64 01/14/2022   CHOLHDL 3.2 08/27/2021   Lab Results  Component Value Date   TSH 1.87 07/14/2021   No results found for: "HGBA1C" Lab Results  Component Value Date   WBC 3.9 01/14/2022   HGB 15.5 01/14/2022   HCT 45.9 01/14/2022   MCV 92 01/14/2022   PLT 211 01/14/2022   Lab Results  Component Value Date   ALT 20 01/14/2022   AST 19 01/14/2022   ALKPHOS 58 01/14/2022   BILITOT 0.6 01/14/2022   No results found for: "25OHVITD2", "25OHVITD3", "VD25OH"   Review of Systems  Constitutional:  Negative for unexpected weight change.  HENT:  Negative for trouble swallowing.   Eyes:  Negative for blurred vision and visual disturbance.  Respiratory:  Negative for cough, choking, chest tightness, shortness of breath and wheezing.   Cardiovascular:  Negative for chest pain, palpitations,  orthopnea and PND.  Gastrointestinal:  Negative for abdominal pain and blood in stool.  Endocrine: Negative for polydipsia and polyuria.  Genitourinary:  Negative for difficulty urinating.  Neurological:  Negative for numbness and headaches.    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   Bacterial upper respiratory infection 04/18/2014    Allergies  Allergen Reactions   Sulfa Antibiotics Hives    As a child    Past Surgical History:  Procedure Laterality Date   BREAST SURGERY     BUNIONECTOMY     CESAREAN SECTION  2013   CESAREAN SECTION WITH BILATERAL TUBAL LIGATION Bilateral 05/25/2016   Procedure: REPEAT CESAREAN SECTION WITH BILATERAL TUBAL LIGATION;  Surgeon: Nadara Mustard, MD;  Location: [facility];  Service: Obstetrics;  Laterality: Bilateral;  Female @ 1550 Apgars: 8/9 Weight: 7lb  2oz   LAPAROSCOPY  2007   RADIOACTIVE SEED GUIDED EXCISIONAL BREAST BIOPSY Right 04/10/2020   Procedure: RADIOACTIVE SEED GUIDED EXCISIONAL RIGHT BREAST BIOPSY;  Surgeon: Emelia Loron, MD;  Location: [facility];  Service: General;  Laterality: Right;    Social History   Tobacco Use   Smoking status: Never   Smokeless tobacco: Never  Vaping  Use   Vaping Use: Never used  Substance Use Topics   Alcohol use: Yes    Alcohol/week: 0.0 standard drinks of alcohol    Comment: occasional social drinker   Drug use: No     Medication list has been reviewed and updated.  Current Meds  Medication Sig   atorvastatin (LIPITOR) 20 MG tablet Take 1 tablet (20 mg total) by mouth daily. TAKE 1 TABLET(10 MG) BY MOUTH DAILY   cetirizine (ZYRTEC) 10 MG tablet Take 10 mg by mouth daily as needed for allergies.    hydrochlorothiazide (HYDRODIURIL) 12.5 MG tablet Take 1 tablet daily   lisinopril (ZESTRIL) 10 MG tablet Take 1 tablet (10 mg total) by mouth daily.   sertraline (ZOLOFT) 25 MG tablet Take 1 tablet (25 mg total) by mouth daily.       04/08/2022    8:22 AM 01/14/2022    9:21 AM 07/14/2021    8:43 AM 01/22/2021    9:07 AM  GAD 7 : Generalized Anxiety Score  Nervous, Anxious, on Edge 0 0 0 0  Control/stop worrying 0 0 0 0  Worry too much - different things 0 0 1 0  Trouble relaxing 0 0 0 0  Restless 0 0 0 0  Easily annoyed or irritable 0 0 1 0  Afraid - awful might happen 0 0 1 0  Total GAD 7 Score 0 0 3 0  Anxiety Difficulty Not difficult at all Not difficult at all Not difficult at all        04/08/2022    8:22 AM 01/14/2022    9:21 AM 07/14/2021    8:43 AM  Depression screen PHQ 2/9  Decreased Interest 0 0 0  Down, Depressed, Hopeless 0 0 0  PHQ - 2 Score 0 0 0  Altered sleeping 0 0 1  Tired, decreased energy 0 0 0  Change in appetite 0 0 0  Feeling bad or failure about yourself  0 0 0  Trouble concentrating 0 0 0  Moving slowly or fidgety/restless 0 0 0  Suicidal thoughts 0 0 0  PHQ-9 Score 0 0 1  Difficult doing work/chores Not difficult at all Not difficult at all Not difficult at all    BP Readings from Last 3 Encounters:  04/08/22 120/74  01/14/22 (!) 126/100  07/14/21 130/80    Physical Exam Vitals and nursing note reviewed. Exam conducted with a chaperone present.  Constitutional:      General: She is not in acute distress.    Appearance: She is not diaphoretic.  HENT:     Head: Normocephalic and atraumatic.     Right Ear: Tympanic membrane and external ear normal. There is no impacted cerumen.     Left Ear: Tympanic membrane and external ear normal. There is no impacted cerumen.     Nose: Nose normal. No congestion or rhinorrhea.     Mouth/Throat:     Mouth: Mucous membranes are moist.     Pharynx: No oropharyngeal exudate or posterior oropharyngeal  erythema.  Eyes:     General:        Right eye: No discharge.        Left eye: No discharge.     Conjunctiva/sclera: Conjunctivae normal.     Pupils: Pupils are equal, round, and reactive to light.  Neck:     Thyroid: No thyromegaly.     Vascular: No JVD.  Cardiovascular:     Rate and Rhythm: Normal rate and regular rhythm.  Heart sounds: Normal heart sounds. No murmur heard.    No friction rub. No gallop.  Pulmonary:     Effort: Pulmonary effort is normal.     Breath sounds: Normal breath sounds. No wheezing, rhonchi or rales.  Abdominal:     General: Bowel sounds are normal.     Palpations: Abdomen is soft. There is no mass.     Tenderness: There is no abdominal tenderness. There is no guarding.  Musculoskeletal:     Cervical back: Normal range of motion and neck supple. No tenderness.  Lymphadenopathy:     Cervical: No cervical adenopathy.  Neurological:     Mental Status: She is alert.     Deep Tendon Reflexes: Reflexes are normal and symmetric.     Wt Readings from Last 3 Encounters:  04/08/22 142 lb (64.4 kg)  01/14/22 141 lb (64 kg)  07/14/21 134 lb (60.8 kg)    BP 120/74   Pulse 70   Ht 5\' 4"  (1.626 m)   Wt 142 lb (64.4 kg)   LMP 04/06/2022 (Exact Date)   SpO2 99%   BMI 24.37 kg/m   Assessment and Plan:  1. Essential hypertension Chronic.  Controlled.  Stable.  Continue hydrochlorothiazide 12.5 mg once a day and lisinopril 5 mg once a day.  Patient has had blood pressure readings at home of 90/60 range there is some symptomatology with orthostatic dizziness therefore we will back down from 10 mg lisinopril to 5 mg.  Will continue hydrochlorothiazide at current dosing 12.5 mg and will recheck in 6 months. - hydrochlorothiazide (HYDRODIURIL) 12.5 MG tablet; Take 1 tablet daily  Dispense: 90 tablet; Refill: 1 - lisinopril (ZESTRIL) 5 MG tablet; Take 1 tablet (5 mg total) by mouth daily.  Dispense: 90 tablet; Refill: 1       Elizabeth Sauereanna Cyndee Giammarco, MD

## 2022-06-28 LAB — COMPREHENSIVE METABOLIC PANEL
Calcium: 9.4 (ref 8.7–10.7)
Globulin: 2

## 2022-06-28 LAB — LIPID PANEL
Cholesterol: 188 (ref 0–200)
HDL: 66 (ref 35–70)
LDL Cholesterol: 95
LDl/HDL Ratio: 2.8
Triglycerides: 157 (ref 40–160)

## 2022-06-28 LAB — BASIC METABOLIC PANEL
BUN: 14 (ref 4–21)
Chloride: 97 — AB (ref 99–108)
Creatinine: 0.9 (ref 0.5–1.1)
Glucose: 96
Potassium: 4.7 mEq/L (ref 3.5–5.1)
Sodium: 137 (ref 137–147)

## 2022-06-28 LAB — HEPATIC FUNCTION PANEL
ALT: 17 U/L (ref 7–35)
AST: 16 (ref 13–35)
Alkaline Phosphatase: 61 (ref 25–125)

## 2022-06-28 LAB — TSH: TSH: 2.57 (ref 0.41–5.90)

## 2022-06-28 LAB — CBC AND DIFFERENTIAL
HCT: 44 (ref 36–46)
Hemoglobin: 15 (ref 12.0–16.0)
Platelets: 205 10*3/uL (ref 150–400)
WBC: 4.2

## 2022-06-28 LAB — CBC: RBC: 4.78 (ref 3.87–5.11)

## 2022-06-28 LAB — COMPREHENSIVE METABOLIC PANEL WITH GFR: Albumin: 4.7 (ref 3.5–5.0)

## 2022-07-04 ENCOUNTER — Encounter: Payer: Self-pay | Admitting: Family Medicine

## 2022-07-15 ENCOUNTER — Ambulatory Visit: Payer: Self-pay | Admitting: Family Medicine

## 2022-09-29 ENCOUNTER — Other Ambulatory Visit: Payer: Self-pay | Admitting: Family Medicine

## 2022-09-29 DIAGNOSIS — I10 Essential (primary) hypertension: Secondary | ICD-10-CM

## 2022-09-29 NOTE — Telephone Encounter (Signed)
Requested by interface surescripts. Future visit in 2 weeks.  Requested Prescriptions  Pending Prescriptions Disp Refills   lisinopril (ZESTRIL) 5 MG tablet [Pharmacy Med Name: LISINOPRIL 5MG  TABLETS] 90 tablet 0    Sig: TAKE 1 TABLET(5 MG) BY MOUTH DAILY     Cardiovascular:  ACE Inhibitors Passed - 09/29/2022  6:21 AM      Passed - Cr in normal range and within 180 days    Creatinine  Date Value Ref Range Status  06/28/2022 0.9 0.5 - 1.1 Final  10/11/2011 0.67 0.60 - 1.30 mg/dL Final   Creatinine, Ser  Date Value Ref Range Status  01/14/2022 0.87 0.57 - 1.00 mg/dL Final         Passed - K in normal range and within 180 days    Potassium  Date Value Ref Range Status  06/28/2022 4.7 3.5 - 5.1 mEq/L Final  10/11/2011 4.0 3.5 - 5.1 mmol/L Final         Passed - Patient is not pregnant      Passed - Last BP in normal range    BP Readings from Last 1 Encounters:  04/08/22 120/74         Passed - Valid encounter within last 6 months    Recent Outpatient Visits           5 months ago Essential hypertension   [place], MD   8 months ago Essential hypertension   [place] [facility], MD   1 year ago Essential hypertension   [place] [facility], MD   1 year ago Pharyngitis, unspecified etiology   [place] [facility], MD   2 years ago Essential hypertension   [place] [facility], MD       Future Appointments             In 2 weeks Duanne Limerick, MD [facility] at [facility], [facility]

## 2022-10-14 ENCOUNTER — Ambulatory Visit: Payer: No Typology Code available for payment source | Admitting: Family Medicine

## 2022-10-14 ENCOUNTER — Encounter: Payer: Self-pay | Admitting: Family Medicine

## 2022-10-14 VITALS — BP 110/84 | HR 78 | Temp 98.4°F | Ht 64.0 in | Wt 150.0 lb

## 2022-10-14 DIAGNOSIS — I10 Essential (primary) hypertension: Secondary | ICD-10-CM | POA: Diagnosis not present

## 2022-10-14 DIAGNOSIS — F064 Anxiety disorder due to known physiological condition: Secondary | ICD-10-CM | POA: Diagnosis not present

## 2022-10-14 DIAGNOSIS — E782 Mixed hyperlipidemia: Secondary | ICD-10-CM | POA: Diagnosis not present

## 2022-10-14 MED ORDER — LISINOPRIL 2.5 MG PO TABS
2.5000 mg | ORAL_TABLET | Freq: Every day | ORAL | 1 refills | Status: DC
Start: 2022-10-14 — End: 2023-02-24

## 2022-10-14 MED ORDER — ATORVASTATIN CALCIUM 20 MG PO TABS: 20.0000 mg | ORAL_TABLET | Freq: Every day | ORAL | 1 refills | Status: DC

## 2022-10-14 MED ORDER — HYDROCHLOROTHIAZIDE 12.5 MG PO TABS
ORAL_TABLET | ORAL | 1 refills | Status: DC
Start: 2022-10-14 — End: 2023-02-24

## 2022-10-14 MED ORDER — SERTRALINE HCL 25 MG PO TABS: 25.0000 mg | ORAL_TABLET | Freq: Every day | ORAL | 1 refills | Status: DC

## 2022-10-14 NOTE — Progress Notes (Signed)
Date:  10/14/2022   Name:  ARIELLE EBER   DOB:  1981/01/01   MRN:  [id number]   Chief Complaint: Medication Refill and Hypertension  Hypertension This is a chronic problem. The current episode started more than 1 year ago. The problem has been waxing and waning since onset. The problem is controlled. Pertinent negatives include no anxiety, blurred vision, chest pain, headaches, malaise/fatigue, neck pain, orthopnea, palpitations, peripheral edema, PND, shortness of breath or sweats. There are no associated agents to hypertension. There are no known risk factors for coronary artery disease. Past treatments include ACE inhibitors.  Depression        This is a chronic problem.  The current episode started more than 1 year ago.   The problem has been gradually improving since onset.  Associated symptoms include no decreased concentration, no fatigue, no helplessness, no hopelessness, does not have insomnia, not irritable, no restlessness, no decreased interest, no appetite change, no body aches, no myalgias, no headaches, no indigestion, not sad and no suicidal ideas.     The symptoms are aggravated by nothing.  Past treatments include SSRIs - Selective serotonin reuptake inhibitors.  Compliance with treatment is good.  Previous treatment provided moderate relief.   Pertinent negatives include no anxiety.   Lab Results  Component Value Date   NA 137 06/28/2022   K 4.7 06/28/2022   CO2 24 01/14/2022   GLUCOSE 94 01/14/2022   BUN 14 06/28/2022   CREATININE 0.9 06/28/2022   CALCIUM 9.4 06/28/2022   EGFR 85 01/14/2022   GFRNONAA >60 04/07/2020   Lab Results  Component Value Date   CHOL 188 06/28/2022   HDL 66 06/28/2022   LDLCALC 95 06/28/2022   TRIG 157 06/28/2022   CHOLHDL 3.2 08/27/2021   Lab Results  Component Value Date   TSH 2.57 06/28/2022   No results found for: "HGBA1C" Lab Results  Component Value Date   WBC 4.2 06/28/2022   HGB 15.0 06/28/2022   HCT 44 06/28/2022    MCV 92 01/14/2022   PLT 205 06/28/2022   Lab Results  Component Value Date   ALT 17 06/28/2022   AST 16 06/28/2022   ALKPHOS 61 06/28/2022   BILITOT 0.6 01/14/2022   No results found for: "25OHVITD2", "25OHVITD3", "VD25OH"   Review of Systems  Constitutional:  Negative for appetite change, fatigue and malaise/fatigue.  Eyes:  Negative for blurred vision.  Respiratory:  Negative for apnea, cough, choking, shortness of breath and wheezing.   Cardiovascular:  Negative for chest pain, palpitations, orthopnea and PND.  Gastrointestinal:  Negative for abdominal pain, blood in stool and constipation.  Musculoskeletal:  Negative for myalgias and neck pain.  Neurological:  Negative for headaches.  Psychiatric/Behavioral:  Positive for depression. Negative for decreased concentration and suicidal ideas. The patient does not have insomnia.     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   Bacterial upper respiratory infection 04/18/2014    Allergies  Allergen Reactions   Sulfa Antibiotics Hives    As a child    Past Surgical History:  Procedure Laterality Date   BREAST SURGERY     BUNIONECTOMY     CESAREAN SECTION  2013   CESAREAN SECTION WITH BILATERAL TUBAL  LIGATION Bilateral 05/25/2016   Procedure: REPEAT CESAREAN SECTION WITH BILATERAL TUBAL LIGATION;  Surgeon: Nadara Mustard, MD;  Location: [facility] ORS;  Service: Obstetrics;  Laterality: Bilateral;  Female @ 1550 Apgars: 8/9 Weight: 7lb  2oz   LAPAROSCOPY  2007   RADIOACTIVE SEED GUIDED EXCISIONAL BREAST BIOPSY Right 04/10/2020   Procedure: RADIOACTIVE SEED GUIDED EXCISIONAL RIGHT BREAST BIOPSY;  Surgeon: Emelia Loron, MD;  Location: [facility]  OR;  Service: General;  Laterality: Right;    Social History   Tobacco Use   Smoking status: Never   Smokeless tobacco: Never  Vaping Use   Vaping status: Never Used  Substance Use Topics   Alcohol use: Yes    Alcohol/week: 0.0 standard drinks of alcohol    Comment: occasional social drinker   Drug use: No     Medication list has been reviewed and updated.  Current Meds  Medication Sig   atorvastatin (LIPITOR) 20 MG tablet Take 1 tablet (20 mg total) by mouth daily. TAKE 1 TABLET(10 MG) BY MOUTH DAILY   cetirizine (ZYRTEC) 10 MG tablet Take 10 mg by mouth daily as needed for allergies.   hydrochlorothiazide (HYDRODIURIL) 12.5 MG tablet Take 1 tablet daily   lisinopril (ZESTRIL) 5 MG tablet TAKE 1 TABLET(5 MG) BY MOUTH DAILY   sertraline (ZOLOFT) 25 MG tablet Take 1 tablet (25 mg total) by mouth daily.       04/08/2022    8:22 AM 01/14/2022    9:21 AM 07/14/2021    8:43 AM 01/22/2021    9:07 AM  GAD 7 : Generalized Anxiety Score  Nervous, Anxious, on Edge 0 0 0 0  Control/stop worrying 0 0 0 0  Worry too much - different things 0 0 1 0  Trouble relaxing 0 0 0 0  Restless 0 0 0 0  Easily annoyed or irritable 0 0 1 0  Afraid - awful might happen 0 0 1 0  Total GAD 7 Score 0 0 3 0  Anxiety Difficulty Not difficult at all Not difficult at all Not difficult at all        10/14/2022    8:16 AM 10/14/2022    8:15 AM 04/08/2022    8:22 AM  Depression screen PHQ 2/9  Decreased Interest 0 0 0  Down, Depressed, Hopeless 0 0 0  PHQ - 2 Score 0 0 0  Altered sleeping 0 1 0  Tired, decreased energy 0 1 0  Change in appetite 0 2 0  Feeling bad or failure about yourself  0 0 0  Trouble concentrating 0 0 0  Moving slowly or fidgety/restless 0 0 0  Suicidal thoughts 0 0 0  PHQ-9 Score 0 4 0  Difficult doing work/chores   Not difficult at all    BP Readings from Last 3 Encounters:  10/14/22 110/84  04/08/22 120/74  01/14/22 (!) 126/100    Physical Exam Vitals and nursing  note reviewed. Exam conducted with a chaperone present.  Constitutional:      General: She is not irritable.She is not in acute distress.    Appearance: She is not diaphoretic.  HENT:     Head: Normocephalic and atraumatic.     Right Ear: Tympanic membrane, ear canal and external ear normal.     Left Ear: Tympanic membrane, ear canal and external ear normal.     Nose: Nose normal.     Mouth/Throat:     Mouth: Mucous membranes are moist.  Eyes:  General:        Right eye: No discharge.        Left eye: No discharge.     Conjunctiva/sclera: Conjunctivae normal.     Pupils: Pupils are equal, round, and reactive to light.  Neck:     Thyroid: No thyromegaly.     Vascular: No JVD.  Cardiovascular:     Rate and Rhythm: Normal rate and regular rhythm.     Heart sounds: Normal heart sounds. No murmur heard.    No friction rub. No gallop.  Pulmonary:     Effort: Pulmonary effort is normal.     Breath sounds: Normal breath sounds. No wheezing, rhonchi or rales.  Abdominal:     Palpations: Abdomen is soft. There is no mass.     Tenderness: There is no abdominal tenderness.  Musculoskeletal:        General: Normal range of motion.     Cervical back: Normal range of motion and neck supple.  Lymphadenopathy:     Cervical: No cervical adenopathy.  Skin:    General: Skin is warm and dry.  Neurological:     Mental Status: She is alert.     Deep Tendon Reflexes: Reflexes are normal and symmetric.     Wt Readings from Last 3 Encounters:  10/14/22 150 lb (68 kg)  04/08/22 142 lb (64.4 kg)  01/14/22 141 lb (64 kg)    BP 110/84 (BP Location: Left Arm, Patient Position: Sitting, Cuff Size: Normal)   Pulse 78   Temp 98.4 F (36.9 C) (Oral)   Wt 150 lb (68 kg)   LMP 10/13/2022   SpO2 98%   BMI 25.75 kg/m   Assessment and Plan: 1. Essential hypertension Chronic.  Controlled.  Stable.  Blood pressure 110/84.  Asymptomatic.  Tolerating medications fairly.  Patient had an episode  that she had some slurred speech but it was after some alcohol but does not think it was an amount that could cause.  So my concern is that she may have had to lower blood pressure and may have underperfused so we will drop the dosing of the lisinopril to 2.5 mg once a day and continue hydrochlorothiazide at current dosing of 12.5 mg once a day.  Will recheck in 6 months patient is a return if they have any further episodes. - lisinopril (ZESTRIL) 2.5 MG tablet; Take 1 tablet (2.5 mg total) by mouth daily.  Dispense: 90 tablet; Refill: 1 - hydrochlorothiazide (HYDRODIURIL) 12.5 MG tablet; Take 1 tablet daily  Dispense: 90 tablet; Refill: 1  2. Mixed hyperlipidemia Chronic.  Controlled.  Stable.  Asymptomatic.  Continue atorvastatin 20 mg once a day.  Patient has not been experiencing any myalgias. - atorvastatin (LIPITOR) 20 MG tablet; Take 1 tablet (20 mg total) by mouth daily. TAKE 1 TABLET(10 MG) BY MOUTH DAILY  Dispense: 90 tablet; Refill: 1  3. Anxiety disorder due to known physiological condition Chronic.  Controlled.  Stable.  PHQ is 0 GAD score 0 continue sertraline 25 mg once a day.  Will recheck in 6 months. - sertraline (ZOLOFT) 25 MG tablet; Take 1 tablet (25 mg total) by mouth daily.  Dispense: 90 tablet; Refill: 1     Elizabeth Sauer, MD

## 2022-12-09 ENCOUNTER — Ambulatory Visit: Payer: No Typology Code available for payment source | Admitting: Physician Assistant

## 2022-12-09 ENCOUNTER — Encounter: Payer: Self-pay | Admitting: Physician Assistant

## 2022-12-09 ENCOUNTER — Ambulatory Visit: Payer: Self-pay | Admitting: *Deleted

## 2022-12-09 VITALS — BP 110/84 | HR 88 | Temp 97.9°F | Ht 64.0 in | Wt 156.0 lb

## 2022-12-09 DIAGNOSIS — J209 Acute bronchitis, unspecified: Secondary | ICD-10-CM

## 2022-12-09 MED ORDER — AZITHROMYCIN 250 MG PO TABS: ORAL_TABLET | ORAL | 0 refills | Status: AC

## 2022-12-09 MED ORDER — PREDNISONE 10 MG PO TABS: ORAL_TABLET | ORAL | 0 refills | Status: AC

## 2022-12-09 NOTE — Patient Instructions (Signed)
-

## 2022-12-09 NOTE — Telephone Encounter (Signed)
  Chief Complaint: coughing Symptoms: coughing- causing SOB at times Frequency: 3 weeks- patient did telehealth visit- remedies offered did not resolve symptoms Pertinent Negatives: Patient denies fever Disposition: [] ED /[] Urgent Care (no appt availability in office) / [x] Appointment(In office/virtual)/ []  [facility]/ [] Home Care/ [] Refused Recommended Disposition /[] [facility]/ []  Follow-up with PCP Additional Notes: Appointment has been scheduled

## 2022-12-09 NOTE — Telephone Encounter (Signed)
Reason for Disposition  [1] Continuous (nonstop) coughing interferes with work or school AND [2] no improvement using cough treatment per Care Advice  Answer Assessment - Initial Assessment Questions 1. ONSET: "When did the cough begin?"      3 weeks 2. SEVERITY: "How bad is the cough today?"      Causes SOB at times 3. SPUTUM: "Describe the color of your sputum" (none, dry cough; clear, white, yellow, green)     No-more yellow 4. HEMOPTYSIS: "Are you coughing up any blood?" If so ask: "How much?" (flecks, streaks, tablespoons, etc.)     no 5. DIFFICULTY BREATHING: "Are you having difficulty breathing?" If Yes, ask: "How bad is it?" (e.g., mild, moderate, severe)    - MILD: No SOB at rest, mild SOB with walking, speaks normally in sentences, can lie down, no retractions, pulse < 100.    - MODERATE: SOB at rest, SOB with minimal exertion and prefers to sit, cannot lie down flat, speaks in phrases, mild retractions, audible wheezing, pulse 100-120.    - SEVERE: Very SOB at rest, speaks in single words, struggling to breathe, sitting hunched forward, retractions, pulse > 120      Just with cough 6. FEVER: "Do you have a fever?" If Yes, ask: "What is your temperature, how was it measured, and when did it start?"     no   10. OTHER SYMPTOMS: "Do you have any other symptoms?" (e.g., runny nose, wheezing, chest pain)       congestion  Protocols used: Cough - Acute Productive-A-AH

## 2022-12-09 NOTE — Progress Notes (Signed)
Date:  12/09/2022   Name:  Denise Clarke   DOB:  Jul 15, 1980   MRN:  [id number]   Chief Complaint: Cough (Congestion )  Cough This is a recurrent problem. Episode onset: X2.5 weeks. The problem has been gradually worsening. The problem occurs every few minutes. The cough is Productive of sputum (yellow mucous in the morning). Associated symptoms include heartburn, nasal congestion, postnasal drip, a sore throat and shortness of breath. Nothing aggravates the symptoms. Treatments tried: inhaler, mucinex, tessalon. The treatment provided mild relief.   Denise Clarke is a pleasant 42 year old female who presents new to me today for evaluation of persistent cough as noted above.  She had a telehealth visit 2wk ago with an external organization and was diagnosed with bronchitis for which she was given albuterol, benzonatate, and suggested to take Mucinex.  She had some improvement, but noticed that she began to slide backwards symptomatically over the last week or so.  Medication list has been reviewed and updated.  Current Meds  Medication Sig   albuterol (VENTOLIN HFA) 108 (90 Base) MCG/ACT inhaler SMARTSIG:2 Puff(s) By Mouth 4 Times Daily PRN   atorvastatin (LIPITOR) 20 MG tablet Take 1 tablet (20 mg total) by mouth daily. TAKE 1 TABLET(10 MG) BY MOUTH DAILY   cetirizine (ZYRTEC) 10 MG tablet Take 10 mg by mouth daily as needed for allergies.   hydrochlorothiazide (HYDRODIURIL) 12.5 MG tablet Take 1 tablet daily   lisinopril (ZESTRIL) 2.5 MG tablet Take 1 tablet (2.5 mg total) by mouth daily.   sertraline (ZOLOFT) 25 MG tablet Take 1 tablet (25 mg total) by mouth daily.     Review of Systems  HENT:  Positive for postnasal drip and sore throat.   Respiratory:  Positive for cough and shortness of breath.   Gastrointestinal:  Positive for heartburn.    Patient Active Problem List   Diagnosis Date Noted   History of cervical dysplasia 11/11/2016   Hypertension, postpartum condition or  complication 05/27/2016   History of classical cesarean section 05/25/2016   History of cesarean section, classical 03/11/2016   Anxiety disorder due to known physiological condition 04/18/2014   Bacterial upper respiratory infection 04/18/2014    Allergies  Allergen Reactions   Sulfa Antibiotics Hives    As a child   Sulfamethoxazole-Trimethoprim Hives and Other (See Comments)    Immunization History  Administered Date(s) Administered   Influenza-Unspecified 10/19/2011   Rho (D) Immune Globulin 03/30/2016   Tdap 08/13/2015, 08/13/2015    Past Surgical History:  Procedure Laterality Date   BREAST SURGERY     BUNIONECTOMY     CESAREAN SECTION  2013   CESAREAN SECTION WITH BILATERAL TUBAL LIGATION Bilateral 05/25/2016   Procedure: REPEAT CESAREAN SECTION WITH BILATERAL TUBAL LIGATION;  Surgeon: Nadara Mustard, MD;  Location: [facility] ORS;  Service: Obstetrics;  Laterality: Bilateral;  Female @ 1550 Apgars: 8/9 Weight: 7lb  2oz   LAPAROSCOPY  2007   RADIOACTIVE SEED GUIDED EXCISIONAL BREAST BIOPSY Right 04/10/2020   Procedure: RADIOACTIVE SEED GUIDED EXCISIONAL RIGHT BREAST BIOPSY;  Surgeon: Emelia Loron, MD;  Location: [facility] OR;  Service: General;  Laterality: Right;    Social History   Tobacco Use   Smoking status: Never   Smokeless tobacco: Never  Vaping Use   Vaping status: Never Used  Substance Use Topics   Alcohol use: Yes    Alcohol/week: 0.0 standard drinks of alcohol    Comment: occasional social drinker   Drug use: No    Family  History  Problem Relation Age of Onset   Diabetes Father    Hypertension Father    Cancer Brother        leukemia   Breast cancer Maternal Grandmother 63   Breast cancer Maternal Aunt         04/08/2022    8:22 AM 01/14/2022    9:21 AM 07/14/2021    8:43 AM 01/22/2021    9:07 AM  GAD 7 : Generalized Anxiety Score  Nervous, Anxious, on Edge 0 0 0 0  Control/stop worrying 0 0 0 0  Worry too much - different things 0 0 1 0   Trouble relaxing 0 0 0 0  Restless 0 0 0 0  Easily annoyed or irritable 0 0 1 0  Afraid - awful might happen 0 0 1 0  Total GAD 7 Score 0 0 3 0  Anxiety Difficulty Not difficult at all Not difficult at all Not difficult at all        10/14/2022    8:16 AM 10/14/2022    8:15 AM 04/08/2022    8:22 AM  Depression screen PHQ 2/9  Decreased Interest 0 0 0  Down, Depressed, Hopeless 0 0 0  PHQ - 2 Score 0 0 0  Altered sleeping 0 1 0  Tired, decreased energy 0 1 0  Change in appetite 0 2 0  Feeling bad or failure about yourself  0 0 0  Trouble concentrating 0 0 0  Moving slowly or fidgety/restless 0 0 0  Suicidal thoughts 0 0 0  PHQ-9 Score 0 4 0  Difficult doing work/chores   Not difficult at all    BP Readings from Last 3 Encounters:  10/14/22 110/84  04/08/22 120/74  01/14/22 (!) 126/100    Wt Readings from Last 3 Encounters:  10/14/22 150 lb (68 kg)  04/08/22 142 lb (64.4 kg)  01/14/22 141 lb (64 kg)    Ht 5\' 4"  (1.626 m)   BMI 25.75 kg/m   Physical Exam Vitals and nursing note reviewed.  Constitutional:      Appearance: Normal appearance.  Cardiovascular:     Rate and Rhythm: Normal rate and regular rhythm.     Heart sounds: No murmur heard.    No friction rub. No gallop.  Pulmonary:     Effort: Pulmonary effort is normal.     Breath sounds: Wheezing present.     Comments: Borderline subtle crackles right lower lobe Abdominal:     General: There is no distension.  Musculoskeletal:        General: Normal range of motion.  Skin:    General: Skin is warm and dry.  Neurological:     Mental Status: She is alert and oriented to person, place, and time.     Gait: Gait is intact.  Psychiatric:        Mood and Affect: Mood and affect normal.     Recent Labs     Component Value Date/Time   NA 137 06/28/2022 0000   NA 142 10/11/2011 2259   K 4.7 06/28/2022 0000   K 4.0 10/11/2011 2259   CL 97 (A) 06/28/2022 0000   CL 109 (H) 10/11/2011 2259   CO2 24  01/14/2022 0956   CO2 20 (L) 10/11/2011 2259   GLUCOSE 94 01/14/2022 0956   GLUCOSE 102 (H) 04/07/2020 0900   GLUCOSE 95 10/11/2011 2259   BUN 14 06/28/2022 0000   BUN 8 10/11/2011 2259   CREATININE 0.9 06/28/2022  0000   CREATININE 0.87 01/14/2022 0956   CREATININE 0.67 10/11/2011 2259   CALCIUM 9.4 06/28/2022 0000   CALCIUM 8.5 10/11/2011 2259   PROT 7.0 01/14/2022 0956   ALBUMIN 4.7 06/28/2022 0000   ALBUMIN 4.8 01/14/2022 0956   AST 16 06/28/2022 0000   AST 21 10/11/2011 2259   ALT 17 06/28/2022 0000   ALKPHOS 61 06/28/2022 0000   BILITOT 0.6 01/14/2022 0956   GFRNONAA >60 04/07/2020 0900   GFRNONAA >60 10/11/2011 2259   GFRAA 94 01/10/2020 1004   GFRAA >60 10/11/2011 2259    Lab Results  Component Value Date   WBC 4.2 06/28/2022   HGB 15.0 06/28/2022   HCT 44 06/28/2022   MCV 92 01/14/2022   PLT 205 06/28/2022   No results found for: "HGBA1C" Lab Results  Component Value Date   CHOL 188 06/28/2022   HDL 66 06/28/2022   LDLCALC 95 06/28/2022   TRIG 157 06/28/2022   CHOLHDL 3.2 08/27/2021   Lab Results  Component Value Date   TSH 2.57 06/28/2022     Assessment and Plan:  1. Acute bronchitis, unspecified organism Given duration of symptoms, history of bronchitis, and worsening over the last week, we will treat with prednisone and azithromycin as below.  - azithromycin (ZITHROMAX) 250 MG tablet; Take 2 tablets on day 1, then 1 tablet daily on days 2 through 5  Dispense: 6 tablet; Refill: 0 - predniSONE (DELTASONE) 10 MG tablet; Take 6 tablets (60 mg total) by mouth daily with breakfast for 1 day, THEN 5 tablets (50 mg total) daily with breakfast for 1 day, THEN 4 tablets (40 mg total) daily with breakfast for 1 day, THEN 3 tablets (30 mg total) daily with breakfast for 1 day, THEN 2 tablets (20 mg total) daily with breakfast for 1 day, THEN 1 tablet (10 mg total) daily with breakfast for 1 day.  Dispense: 21 tablet; Refill: 0   F/u PRN   Alvester Morin, PA-C,  DMSc, Nutritionist [facility] [phone]

## 2022-12-19 ENCOUNTER — Encounter: Payer: Self-pay | Admitting: Family Medicine

## 2022-12-23 ENCOUNTER — Encounter: Payer: Self-pay | Admitting: Family Medicine

## 2022-12-23 ENCOUNTER — Ambulatory Visit: Payer: No Typology Code available for payment source | Admitting: Family Medicine

## 2022-12-23 VITALS — BP 125/70 | HR 92 | Ht 64.0 in | Wt 154.4 lb

## 2022-12-23 DIAGNOSIS — L0100 Impetigo, unspecified: Secondary | ICD-10-CM

## 2022-12-23 NOTE — Progress Notes (Signed)
Date:  12/23/2022   Name:  Denise Clarke   DOB:  11-01-1980   MRN:  [id number]   Chief Complaint: Nose Sore (Pt [facility] clinic today c/o follow up for sore on nose. Pt state sore came up after last dose of prednisone.)  Rash This is a new problem. The current episode started 1 to 4 weeks ago. The problem has been gradually worsening since onset. The affected locations include the face. The rash is characterized by redness and swelling. Pertinent negatives include no congestion, cough, diarrhea, eye pain, fever, rhinorrhea or shortness of breath. Past treatments include antibiotic cream and antibiotics (septra). The treatment provided no relief.    Lab Results  Component Value Date   NA 137 06/28/2022   K 4.7 06/28/2022   CO2 24 01/14/2022   GLUCOSE 94 01/14/2022   BUN 14 06/28/2022   CREATININE 0.9 06/28/2022   CALCIUM 9.4 06/28/2022   EGFR 85 01/14/2022   GFRNONAA >60 04/07/2020   Lab Results  Component Value Date   CHOL 188 06/28/2022   HDL 66 06/28/2022   LDLCALC 95 06/28/2022   TRIG 157 06/28/2022   CHOLHDL 3.2 08/27/2021   Lab Results  Component Value Date   TSH 2.57 06/28/2022   No results found for: "HGBA1C" Lab Results  Component Value Date   WBC 4.2 06/28/2022   HGB 15.0 06/28/2022   HCT 44 06/28/2022   MCV 92 01/14/2022   PLT 205 06/28/2022   Lab Results  Component Value Date   ALT 17 06/28/2022   AST 16 06/28/2022   ALKPHOS 61 06/28/2022   BILITOT 0.6 01/14/2022   No results found for: "25OHVITD2", "25OHVITD3", "VD25OH"   Review of Systems  Constitutional:  Negative for fever.  HENT:  Negative for congestion, nosebleeds, postnasal drip and rhinorrhea.   Eyes:  Negative for pain.  Respiratory:  Negative for cough and shortness of breath.   Cardiovascular:  Negative for chest pain and palpitations.  Gastrointestinal:  Negative for diarrhea.  Skin:  Positive for rash.    Patient Active Problem List   Diagnosis Date Noted   History of  cervical dysplasia 11/11/2016   Hypertension, postpartum condition or complication 05/27/2016   History of classical cesarean section 05/25/2016   History of cesarean section, classical 03/11/2016   Anxiety disorder due to known physiological condition 04/18/2014   Bacterial upper respiratory infection 04/18/2014    Allergies  Allergen Reactions   Sulfa Antibiotics Hives    As a child   Sulfamethoxazole-Trimethoprim Hives and Other (See Comments)    Past Surgical History:  Procedure Laterality Date   BREAST SURGERY     BUNIONECTOMY     CESAREAN SECTION  2013   CESAREAN SECTION WITH BILATERAL TUBAL LIGATION Bilateral 05/25/2016   Procedure: REPEAT CESAREAN SECTION WITH BILATERAL TUBAL LIGATION;  Surgeon: Nadara Mustard, MD;  Location: [facility] ORS;  Service: Obstetrics;  Laterality: Bilateral;  Female @ 1550 Apgars: 8/9 Weight: 7lb  2oz   LAPAROSCOPY  2007   RADIOACTIVE SEED GUIDED EXCISIONAL BREAST BIOPSY Right 04/10/2020   Procedure: RADIOACTIVE SEED GUIDED EXCISIONAL RIGHT BREAST BIOPSY;  Surgeon: Emelia Loron, MD;  Location: [facility] OR;  Service: General;  Laterality: Right;    Social History   Tobacco Use   Smoking status: Never   Smokeless tobacco: Never  Vaping Use   Vaping status: Never Used  Substance Use Topics   Alcohol use: Yes    Alcohol/week: 0.0 standard drinks of alcohol  Comment: occasional social drinker   Drug use: No     Medication list has been reviewed and updated.  Current Meds  Medication Sig   atorvastatin (LIPITOR) 20 MG tablet Take 1 tablet (20 mg total) by mouth daily. TAKE 1 TABLET(10 MG) BY MOUTH DAILY   cetirizine (ZYRTEC) 10 MG tablet Take 10 mg by mouth daily as needed for allergies.   hydrochlorothiazide (HYDRODIURIL) 12.5 MG tablet Take 1 tablet daily   lisinopril (ZESTRIL) 2.5 MG tablet Take 1 tablet (2.5 mg total) by mouth daily.   sertraline (ZOLOFT) 25 MG tablet Take 1 tablet (25 mg total) by mouth daily.       12/23/2022     1:24 PM 12/09/2022   11:15 AM 04/08/2022    8:22 AM 01/14/2022    9:21 AM  GAD 7 : Generalized Anxiety Score  Nervous, Anxious, on Edge 0 1 0 0  Control/stop worrying 0 1 0 0  Worry too much - different things 0 1 0 0  Trouble relaxing 0 0 0 0  Restless 0 0 0 0  Easily annoyed or irritable 0 0 0 0  Afraid - awful might happen 0 0 0 0  Total GAD 7 Score 0 3 0 0  Anxiety Difficulty Not difficult at all Not difficult at all Not difficult at all Not difficult at all       12/23/2022    1:24 PM 12/09/2022   11:15 AM 10/14/2022    8:16 AM  Depression screen PHQ 2/9  Decreased Interest 0 0 0  Down, Depressed, Hopeless 0 0 0  PHQ - 2 Score 0 0 0  Altered sleeping 0 0 0  Tired, decreased energy 0 0 0  Change Denise appetite 0 0 0  Feeling bad or failure about yourself  0 0 0  Trouble concentrating 0 0 0  Moving slowly or fidgety/restless 0 0 0  Suicidal thoughts 0 0 0  PHQ-9 Score 0 0 0  Difficult doing work/chores Not difficult at all Not difficult at all     BP Readings from Last 3 Encounters:  12/23/22 125/70  12/09/22 110/84  10/14/22 110/84    Physical Exam Vitals and nursing note reviewed.  HENT:     Right Ear: Tympanic membrane and ear canal normal.     Left Ear: Tympanic membrane and ear canal normal.     Nose: Nasal tenderness present. No septal deviation or mucosal edema.      Comments: Honey crusted area inferior aspect nose/lateral asect swelling /induration/ tender     Wt Readings from Last 3 Encounters:  12/23/22 154 lb 6.4 oz (70 kg)  12/09/22 156 lb (70.8 kg)  10/14/22 150 lb (68 kg)    BP 125/70   Pulse 92   Ht 5\' 4"  (1.626 m)   Wt 154 lb 6.4 oz (70 kg)   SpO2 95%   BMI 26.50 kg/m   Assessment and Plan: 1. Impetigo (Primary) New onset.  Persistent.  Gradually worsening and not controlled.  I think relatively safe and that there is no fever or chills.  Localized infection concern of the possibility of abscess definitely has cellulitic concern.  I  do not think cartilage is involved we will go ahead and refer to ear nose and throat.    Elizabeth Sauer, MD

## 2022-12-29 ENCOUNTER — Other Ambulatory Visit: Payer: Self-pay | Admitting: Family Medicine

## 2022-12-29 DIAGNOSIS — I10 Essential (primary) hypertension: Secondary | ICD-10-CM

## 2023-01-10 ENCOUNTER — Other Ambulatory Visit: Payer: Self-pay | Admitting: Obstetrics & Gynecology

## 2023-01-10 DIAGNOSIS — Z1231 Encounter for screening mammogram for malignant neoplasm of breast: Secondary | ICD-10-CM

## 2023-01-26 ENCOUNTER — Ambulatory Visit
Admission: RE | Admit: 2023-01-26 | Discharge: 2023-01-26 | Disposition: A | Discharge status: Home / Self Care | Payer: No Typology Code available for payment source | Source: Ambulatory Visit | Attending: Obstetrics & Gynecology | Admitting: Obstetrics & Gynecology

## 2023-01-26 DIAGNOSIS — Z1231 Encounter for screening mammogram for malignant neoplasm of breast: Secondary | ICD-10-CM

## 2023-01-30 ENCOUNTER — Other Ambulatory Visit: Payer: Self-pay | Admitting: Obstetrics & Gynecology

## 2023-01-30 DIAGNOSIS — R928 Other abnormal and inconclusive findings on diagnostic imaging of breast: Secondary | ICD-10-CM

## 2023-02-07 ENCOUNTER — Encounter: Payer: Self-pay | Admitting: Family Medicine

## 2023-02-09 ENCOUNTER — Ambulatory Visit
Admission: RE | Admit: 2023-02-09 | Discharge: 2023-02-09 | Disposition: A | Discharge status: Home / Self Care | Payer: No Typology Code available for payment source | Source: Ambulatory Visit | Attending: Obstetrics & Gynecology | Admitting: Obstetrics & Gynecology

## 2023-02-09 ENCOUNTER — Ambulatory Visit: Payer: No Typology Code available for payment source

## 2023-02-09 DIAGNOSIS — R928 Other abnormal and inconclusive findings on diagnostic imaging of breast: Secondary | ICD-10-CM

## 2023-02-24 ENCOUNTER — Encounter: Payer: Self-pay | Admitting: Family Medicine

## 2023-02-24 ENCOUNTER — Ambulatory Visit: Payer: No Typology Code available for payment source | Admitting: Family Medicine

## 2023-02-24 ENCOUNTER — Other Ambulatory Visit: Payer: Self-pay

## 2023-02-24 VITALS — BP 126/80 | HR 73 | Ht 64.0 in | Wt 145.6 lb

## 2023-02-24 DIAGNOSIS — H8302 Labyrinthitis, left ear: Secondary | ICD-10-CM | POA: Diagnosis not present

## 2023-02-24 DIAGNOSIS — I10 Essential (primary) hypertension: Secondary | ICD-10-CM

## 2023-02-24 DIAGNOSIS — R110 Nausea: Secondary | ICD-10-CM

## 2023-02-24 MED ORDER — ONDANSETRON HCL 4 MG PO TABS: 4.0000 mg | ORAL_TABLET | Freq: Three times a day (TID) | ORAL | 0 refills | Status: DC | PRN

## 2023-02-24 MED ORDER — PREDNISONE 10 MG PO TABS: ORAL_TABLET | ORAL | 0 refills | Status: DC

## 2023-02-24 MED ORDER — MECLIZINE HCL 25 MG PO TABS: 25.0000 mg | ORAL_TABLET | Freq: Three times a day (TID) | ORAL | 0 refills | Status: DC | PRN

## 2023-02-24 MED ORDER — FLUTICASONE PROPIONATE 50 MCG/ACT NA SUSP: 2.0000 | Freq: Every day | NASAL | 6 refills | Status: AC

## 2023-02-24 MED ORDER — HYDROCHLOROTHIAZIDE 12.5 MG PO TABS: ORAL_TABLET | ORAL | 1 refills | Status: DC

## 2023-02-24 NOTE — Patient Instructions (Signed)
 Vertigo Vertigo is the feeling that you or the things around you are moving or spinning when they're not. It's different than feeling dizzy. It can also cause: Loss of balance. Trouble standing or walking. Nausea and vomiting. This feeling can come and go at any time. It can last from a few seconds to minutes or even hours. It may go away on its own or be treated with medicine. What are the types of vertigo? There are two types of vertigo: Peripheral vertigo happens when parts of your inner ear don't work like they should. This is the more common type. Central vertigo happens when your brain and spinal cord don't work like they should. Your health care provider will do tests to find out what kind of vertigo you have. This will help them decide on the right treatment for you. Follow these instructions at home: Eating and drinking Drink enough fluid to keep your pee (urine) pale yellow. Do not drink alcohol. Activity When you get up in the morning, first sit up on the side of the bed. When you feel okay, stand slowly while holding onto something. Move slowly. Avoid sudden body or head movements. Avoid certain positions, as told by your provider. Use a cane if you have trouble standing or walking. Sit down right away if you feel unsteady. Place items in your home so they're easy for you to reach without bending or leaning over. Return to normal activities when you're told. Ask what things are safe for you to do. General instructions Take your medicines only as told by your provider. Contact a health care provider if: Your medicines don't help or make your vertigo worse. You get new symptoms. You have a fever. You have nausea or vomiting. Your family or friends spot any changes in how you're acting. A part of your body goes numb. You feel tingling and prickling in a part of your body. You get very bad headaches. Get help right away if: You're always dizzy or you faint. You have a  stiff neck. You have trouble moving or speaking. Your hands, arms, or legs feel weak. Your hearing or eyesight changes. These symptoms may be an emergency. Call 911 right away. Do not wait to see if the symptoms will go away. Do not drive yourself to the hospital. This information is not intended to replace advice given to you by your health care provider. Make sure you discuss any questions you have with your health care provider. Document Revised: 09/22/2022 Document Reviewed: 03/25/2022 Elsevier Patient Education  2024 [employer]Labyrinthitis  Labyrinthitis is an inner ear infection. The inner ear is a system of tubes and canals (labyrinth) that are filled with fluid. The inner ear also contains nerve cells that send hearing and balance signals to the brain. When tiny germs (microorganisms) get inside the labyrinth, they harm the cells that send messages to the brain. This can cause changes in hearing and balance. Labyrinthitis usually develops suddenly and goes away with treatment in a few weeks (acute labyrinthitis). If the infection damages parts of the labyrinth, some symptoms may last for a long time (chronic labyrinthitis). What are the causes? Labyrinthitis can be caused by viruses, such as one that causes: Infectious mononucleosis, also called mono. Measles or mumps. The flu (influenza). Herpes. Labyrinthitis can also be caused by bacteria that spread from an infection in the brain or the middle ear (suppurative labyrinthitis). In some cases, the bacteria may produce a poison (toxin) that gets inside the labyrinth (serous  labyrinthitis). What increases the risk? You may be at greater risk for labyrinthitis if you: Recently had a mouth, nose, or throat infection (upper respiratory infection) or an ear infection. Drink a lot of alcohol. Smoke. Use certain drugs. Are feeling tired (fatigued). Are experiencing a lot of stress. Have allergies. What are the signs or  symptoms? Symptoms of labyrinthitis usually start suddenly. The symptoms may range from mild to severe, and may include: Dizziness. Hearing loss. A feeling that you or your surroundings are moving when they are not (vertigo). Ringing in your ear (tinnitus). Nausea and vomiting. Trouble focusing your eyes. Symptoms of chronic labyrinthitis may include: Fatigue. Confusion. Hearing loss. Tinnitus. Poor balance. Vertigo after sudden head movements. How is this diagnosed? This condition may be diagnosed based on: Your symptoms and medical history. Your health care provider may ask about any dizziness or hearing loss you have and any recent upper respiratory infections. A physical exam that involves: Checking your ears for infection. Testing your balance. Checking your eye movement. Hearing tests. Imaging tests, such as a CT scan or an MRI. Tests of your eye movements (electronystagmogram, or ENG). How is this treated? Treatment depends on the cause. If your condition is caused by bacteria, you may need antibiotic medicine. If it is caused by a virus, it may get better on its own. Regardless of the cause, you may be treated with: Medicines to: Stop dizziness. Relieve nausea. Reduce inflammation. Speed your recovery. IV fluids. These may be given at a hospital. You may need IV fluids if you have severe nausea and vomiting. Physical therapy. A therapist can teach you exercises to help you adjust to feeling dizzy (vestibular rehabilitation exercises). You may need this if you have dizziness that does not go away. Follow these instructions at home: Medicines Take over-the-counter and prescription medicines only as told by your health care provider. If you were prescribed an antibiotic medicine, take it as told by your health care provider. Do not stop taking the antibiotic even if you start to feel better. Activity Rest as told by your health care provider. Limit your activity as  directed. Ask your health care provider what activities are safe for you. Do not make sudden movements until any dizziness goes away. If physical therapy was prescribed, do exercises as directed. General instructions Avoid loud noises and bright lights. Do not drive until your health care provider says that this is safe for you. Drink enough fluid to keep your urine pale yellow. Keep all follow-up visits. This is important. Contact a health care provider if you have: Symptoms that do not get better with medicine. Symptoms that last longer than 2 weeks. A fever. Get help right away if you have: Nausea or vomiting that is severe or does not go away. Severe dizziness. Sudden hearing loss. Summary Labyrinthitis is an infection of the inner ear. It can cause changes in hearing and balance or vertigo. Symptoms usually start suddenly and include dizziness, hearing loss, nausea, and vomiting. You may also have ringing in your ear (tinnitus), trouble focusing your eyes, and vertigo. If the condition lasts more than a few weeks, symptoms may include fatigue, confusion, hearing loss, poor balance, tinnitus, and vertigo. Treatment depends on the cause. If your labyrinthitis is caused by bacteria, you may need antibiotic medicine. If your labyrinthitis is caused by a virus, it may get better on its own. Follow your health care provider's instructions, including how to take medicines, what activities to avoid, and when to get  medical help. This information is not intended to replace advice given to you by your health care provider. Make sure you discuss any questions you have with your health care provider. Document Revised: 01/29/2020 Document Reviewed: 01/29/2020 Elsevier Patient Education  2024 [employer]How to Perform the Epley Maneuver The Epley maneuver is an exercise that relieves symptoms of vertigo. Vertigo is the feeling that you or your surroundings are moving when they are not. When you  feel vertigo, you may feel like the room is spinning and may have trouble walking. The Epley maneuver is used for a type of vertigo caused by a calcium deposit in a part of the inner ear. The maneuver involves changing head positions to help the deposit move out of the area. You can do this maneuver at home whenever you have symptoms of vertigo. You can repeat it in 24 hours if your vertigo has not gone away. Even though the Epley maneuver may relieve your vertigo for a few weeks, it is possible that your symptoms will return. This maneuver relieves vertigo, but it does not relieve dizziness. What are the risks? If it is done correctly, the Epley maneuver is considered safe. Sometimes it can lead to dizziness or nausea that goes away after a short time. If you develop other symptoms--such as changes in vision, weakness, or numbness--stop doing the maneuver and call your health care provider. Supplies needed: A bed or table. A pillow. How to do the Epley maneuver     Sit on the edge of a bed or table with your back straight and your legs extended or hanging over the edge of the bed or table. Turn your head halfway toward the affected ear or side as told by your health care provider. Lie backward quickly with your head turned until you are lying flat on your back. Your head should dangle (head-hanging position). You may want to position a pillow under your shoulders. Hold this position for at least 30 seconds. If you feel dizzy or have symptoms of vertigo, continue to hold the position until the symptoms stop. Turn your head to the opposite direction until your unaffected ear is facing down. Your head should continue to dangle. Hold this position for at least 30 seconds. If you feel dizzy or have symptoms of vertigo, continue to hold the position until the symptoms stop. Turn your whole body to the same side as your head so that you are positioned on your side. Your head will now be nearly facedown  and no longer needs to dangle. Hold for at least 30 seconds. If you feel dizzy or have symptoms of vertigo, continue to hold the position until the symptoms stop. Sit back up. You can repeat the maneuver in 24 hours if your vertigo does not go away. Follow these instructions at home: For 24 hours after doing the Epley maneuver: Keep your head in an upright position. When lying down to sleep or rest, keep your head raised (elevated) with two or more pillows. Avoid excessive neck movements. Activity Do not drive or use machinery if you feel dizzy. After doing the Epley maneuver, return to your normal activities as told by your health care provider. Ask your health care provider what activities are safe for you. General instructions Drink enough fluid to keep your urine pale yellow. Do not drink alcohol. Take over-the-counter and prescription medicines only as told by your health care provider. Keep all follow-up visits. This is important. Preventing vertigo symptoms Ask your health  care provider if there is anything you should do at home to prevent vertigo. He or she may recommend that you: Keep your head elevated with two or more pillows while you sleep. Do not sleep on the side of your affected ear. Get up slowly from bed. Avoid sudden movements during the day. Avoid extreme head positions or movement, such as looking up or bending over. Contact a health care provider if: Your vertigo gets worse. You have other symptoms, including: Nausea. Vomiting. Headache. Get help right away if you: Have vision changes. Have a headache or neck pain that is severe or getting worse. Cannot stop vomiting. Have new numbness or weakness in any part of your body. These symptoms may represent a serious problem that is an emergency. Do not wait to see if the symptoms will go away. Get medical help right away. Call your local emergency services (911 in the [place] Do not drive yourself to the  hospital. Summary Vertigo is the feeling that you or your surroundings are moving when they are not. The Epley maneuver is an exercise that relieves symptoms of vertigo. If the Epley maneuver is done correctly, it is considered safe. This information is not intended to replace advice given to you by your health care provider. Make sure you discuss any questions you have with your health care provider. Document Revised: 09/16/2022 Document Reviewed: 09/16/2022 Elsevier Patient Education  2024 [employer].

## 2023-02-24 NOTE — Progress Notes (Signed)
 Date:  02/24/2023   Name:  Denise Clarke   DOB:  1980/03/01   MRN:  [id number]   Chief Complaint: Hypertension and Dizziness (Vertigo that started Yesterday. Dizziness when turning head or laying on left side. Nausea. )  Hypertension This is a chronic problem. The current episode started more than 1 year ago. The problem has been waxing and waning since onset. The problem is controlled. Pertinent negatives include no blurred vision, chest pain, headaches, neck pain, orthopnea, palpitations or shortness of breath. There are no associated agents to hypertension. There are no known risk factors for coronary artery disease. Past treatments include diuretics. The current treatment provides moderate improvement. There are no compliance problems.  There is no history of CAD/MI or CVA. There is no history of chronic renal disease, a hypertension causing med or renovascular disease.  Dizziness The current episode started more than 1 year ago. The problem occurs intermittently. The problem has been gradually improving. Pertinent negatives include no abdominal pain, anorexia, arthralgias, change in bowel habit, chest pain, chills, congestion, coughing, diaphoresis, fatigue, fever, headaches, joint swelling, myalgias, nausea, neck pain, numbness, rash, sore throat, swollen glands, urinary symptoms, vertigo, visual change, vomiting or weakness. Nothing aggravates the symptoms. The treatment provided moderate relief.    Lab Results  Component Value Date   NA 137 06/28/2022   K 4.7 06/28/2022   CO2 24 01/14/2022   GLUCOSE 94 01/14/2022   BUN 14 06/28/2022   CREATININE 0.9 06/28/2022   CALCIUM 9.4 06/28/2022   EGFR 85 01/14/2022   GFRNONAA >60 04/07/2020   Lab Results  Component Value Date   CHOL 188 06/28/2022   HDL 66 06/28/2022   LDLCALC 95 06/28/2022   TRIG 157 06/28/2022   CHOLHDL 3.2 08/27/2021   Lab Results  Component Value Date   TSH 2.57 06/28/2022   No results found for:  "HGBA1C" Lab Results  Component Value Date   WBC 4.2 06/28/2022   HGB 15.0 06/28/2022   HCT 44 06/28/2022   MCV 92 01/14/2022   PLT 205 06/28/2022   Lab Results  Component Value Date   ALT 17 06/28/2022   AST 16 06/28/2022   ALKPHOS 61 06/28/2022   BILITOT 0.6 01/14/2022   No results found for: "25OHVITD2", "25OHVITD3", "VD25OH"   Review of Systems  Constitutional:  Negative for chills, diaphoresis, fatigue and fever.  HENT:  Negative for congestion and sore throat.   Eyes:  Negative for blurred vision.  Respiratory:  Negative for cough, choking, chest tightness, shortness of breath and wheezing.   Cardiovascular:  Negative for chest pain, palpitations and orthopnea.  Gastrointestinal:  Negative for abdominal pain, anorexia, change in bowel habit, nausea and vomiting.  Musculoskeletal:  Negative for arthralgias, joint swelling, myalgias and neck pain.  Skin:  Negative for rash.  Neurological:  Positive for dizziness. Negative for vertigo, weakness, numbness and headaches.    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   Bacterial upper respiratory infection 04/18/2014    Allergies  Allergen Reactions   Sulfa Antibiotics Hives    As a child   Sulfamethoxazole-Trimethoprim Hives and Other (See Comments)    Past Surgical History:  Procedure Laterality Date   BREAST SURGERY  BUNIONECTOMY     CESAREAN SECTION  2013   CESAREAN SECTION WITH BILATERAL TUBAL LIGATION Bilateral 05/25/2016   Procedure: REPEAT CESAREAN SECTION WITH BILATERAL TUBAL LIGATION;  Surgeon: Nadara Mustard, MD;  Location: [facility] OR[facility];  Service: Obstetrics;  Laterality: Bilateral;  Female @ 1550 Apgars: 8/9 Weight: 7lb   2oz   LAPAROSCOPY  2007   RADIOACTIVE SEED GUIDED EXCISIONAL BREAST BIOPSY Right 04/10/2020   Procedure: RADIOACTIVE SEED GUIDED EXCISIONAL RIGHT BREAST BIOPSY;  Surgeon: Emelia Loron, MD;  Location: [facility];  Service: General;  Laterality: Right;    Social History   Tobacco Use   Smoking status: Never   Smokeless tobacco: Never  Vaping Use   Vaping status: Never Used  Substance Use Topics   Alcohol use: Yes    Alcohol/week: 0.0 standard drinks of alcohol    Comment: occasional social drinker   Drug use: No     Medication list has been reviewed and updated.  Current Meds  Medication Sig   atorvastatin (LIPITOR) 20 MG tablet Take 1 tablet (20 mg total) by mouth daily. TAKE 1 TABLET(10 MG) BY MOUTH DAILY   cetirizine (ZYRTEC) 10 MG tablet Take 10 mg by mouth daily as needed for allergies.   hydrochlorothiazide (HYDRODIURIL) 12.5 MG tablet Take 1 tablet daily   lisinopril (ZESTRIL) 2.5 MG tablet Take 1 tablet (2.5 mg total) by mouth daily.   lisinopril (ZESTRIL) 5 MG tablet TAKE 1 TABLET(5 MG) BY MOUTH DAILY   sertraline (ZOLOFT) 25 MG tablet Take 1 tablet (25 mg total) by mouth daily.       02/24/2023    1:35 PM 12/23/2022    1:24 PM 12/09/2022   11:15 AM 04/08/2022    8:22 AM  GAD 7 : Generalized Anxiety Score  Nervous, Anxious, on Edge 0 0 1 0  Control/stop worrying 0 0 1 0  Worry too much - different things 0 0 1 0  Trouble relaxing 0 0 0 0  Restless 0 0 0 0  Easily annoyed or irritable 0 0 0 0  Afraid - awful might happen 0 0 0 0  Total GAD 7 Score 0 0 3 0  Anxiety Difficulty Not difficult at all Not difficult at all Not difficult at all Not difficult at all       02/24/2023    1:35 PM 12/23/2022    1:24 PM 12/09/2022   11:15 AM  Depression screen PHQ 2/9  Decreased Interest 0 0 0  Down, Depressed, Hopeless 0 0 0  PHQ - 2 Score 0 0 0  Altered sleeping 0 0 0  Tired, decreased energy 0 0 0  Change in appetite 0 0 0  Feeling bad or failure about yourself   0 0 0  Trouble concentrating 0 0 0  Moving slowly or fidgety/restless 0 0 0  Suicidal thoughts 0 0 0  PHQ-9 Score 0 0 0  Difficult doing work/chores Not difficult at all Not difficult at all Not difficult at all    BP Readings from Last 3 Encounters:  02/24/23 126/80  12/23/22 125/70  12/09/22 110/84    Physical Exam Vitals and nursing note reviewed.  Constitutional:      General: She is not in acute distress.    Appearance: She is not diaphoretic.  HENT:     Head: Normocephalic and atraumatic.     Right Ear: External ear normal. A middle ear effusion is present.     Left Ear: External ear normal. A middle  ear effusion is present.     Nose: Nose normal. No congestion or rhinorrhea.     Mouth/Throat:     Pharynx: No oropharyngeal exudate or posterior oropharyngeal erythema.  Eyes:     General:        Right eye: No discharge.        Left eye: No discharge.     Conjunctiva/sclera: Conjunctivae normal.     Pupils: Pupils are equal, round, and reactive to light.  Neck:     Thyroid: No thyromegaly.     Vascular: No JVD.  Cardiovascular:     Rate and Rhythm: Normal rate and regular rhythm.     Heart sounds: Normal heart sounds. No murmur heard.    No friction rub. No gallop.  Pulmonary:     Effort: Pulmonary effort is normal.     Breath sounds: Normal breath sounds. No wheezing, rhonchi or rales.  Abdominal:     General: Bowel sounds are normal.     Palpations: Abdomen is soft. There is no mass.     Tenderness: There is no abdominal tenderness. There is no guarding.  Musculoskeletal:        General: Normal range of motion.     Cervical back: Normal range of motion and neck supple.  Lymphadenopathy:     Cervical: No cervical adenopathy.  Skin:    General: Skin is warm and dry.  Neurological:     Mental Status: She is alert.     Deep Tendon Reflexes: Reflexes are normal and symmetric.     Wt Readings from Last 3 Encounters:  02/24/23 145 lb 9.6 oz (66 kg)  12/23/22  154 lb 6.4 oz (70 kg)  12/09/22 156 lb (70.8 kg)    BP 126/80   Pulse 73   Ht 5\' 4"  (1.626 m)   Wt 145 lb 9.6 oz (66 kg)   SpO2 98%   BMI 24.99 kg/m   Assessment and Plan: 1. Labyrinthitis of left ear (Primary) New onset.  Persistent.  Relatively stable as long as still.  Try to modify Epley without success.  We will give the regular Epley and proceed with symptomatic relief with meclizine 25 mg every 8 hours as needed as well as fluticasone to decompress the serous otitis which may be contributing to this bilateral.  If patient remains symptomatic she would like to see Dr. Jenne Campus in the ENT and we will make the referral on Monday. - meclizine (ANTIVERT) 25 MG tablet; Take 1 tablet (25 mg total) by mouth 3 (three) times daily as needed for dizziness.  Dispense: 30 tablet; Refill: 0 - fluticasone (FLONASE) 50 MCG/ACT nasal spray; Place 2 sprays into both nostrils daily.  Dispense: 16 g; Refill: 6  2. Nausea New onset.  Persistent.  Relatively stable but is symptomatic and would like some relief with antiemesis medication.  We will prescribe Zofran 4 mg 1 every 8-12 hours as needed nausea vomiting. - ondansetron (ZOFRAN) 4 MG tablet; Take 1 tablet (4 mg total) by mouth every 8 (eight) hours as needed for nausea or vomiting.  Dispense: 20 tablet; Refill: 0   3 essential hypertension-we will continue with the discontinuance of lisinopril secondary to cough and continue with hydrochlorothiazide 12.5 mg once a day.  Review of previous renal panel is acceptable.  Will recheck patient in 6 months.Elizabeth Sauer, MD

## 2023-03-17 ENCOUNTER — Ambulatory Visit: Payer: Self-pay | Admitting: Family Medicine

## 2023-03-24 ENCOUNTER — Ambulatory Visit: Payer: No Typology Code available for payment source | Admitting: Family Medicine

## 2023-03-24 VITALS — BP 124/82 | HR 78 | Resp 16 | Ht 64.0 in | Wt 141.8 lb

## 2023-03-24 DIAGNOSIS — E782 Mixed hyperlipidemia: Secondary | ICD-10-CM

## 2023-03-24 DIAGNOSIS — F064 Anxiety disorder due to known physiological condition: Secondary | ICD-10-CM

## 2023-03-24 DIAGNOSIS — I10 Essential (primary) hypertension: Secondary | ICD-10-CM

## 2023-03-24 MED ORDER — ATORVASTATIN CALCIUM 20 MG PO TABS
20.0000 mg | ORAL_TABLET | Freq: Every day | ORAL | 1 refills | Status: DC
Start: 2023-03-24 — End: 2023-11-03

## 2023-03-24 MED ORDER — SERTRALINE HCL 25 MG PO TABS: 25.0000 mg | ORAL_TABLET | Freq: Every day | ORAL | 1 refills | Status: DC

## 2023-03-24 MED ORDER — HYDROCHLOROTHIAZIDE 12.5 MG PO TABS: ORAL_TABLET | ORAL | 1 refills | Status: DC

## 2023-03-24 NOTE — Progress Notes (Signed)
 Date:  03/24/2023   Name:  Denise Clarke   DOB:  [birth year]   MRN:  [id number]   Chief Complaint: Hypertension (Stopped Lisinopril due to cough 02/24/23 see last note. Cough has stopped. ), Hyperlipidemia (/), and Anxiety  Hypertension This is a chronic problem. The current episode started more than 1 year ago. The problem has been gradually improving since onset. The problem is controlled. Associated symptoms include anxiety. Pertinent negatives include no blurred vision, chest pain, headaches, malaise/fatigue, neck pain, orthopnea, palpitations, peripheral edema, PND, shortness of breath or sweats. There are no associated agents to hypertension. Past treatments include lifestyle changes (decreased sodium intake). The current treatment provides moderate improvement. There are no compliance problems.  There is no history of CAD/MI or CVA. There is no history of chronic renal disease, a hypertension causing med or renovascular disease.  Hyperlipidemia This is a chronic problem. The current episode started more than 1 year ago. The problem is controlled. Recent lipid tests were reviewed and are normal. She has no history of chronic renal disease. Pertinent negatives include no chest pain or shortness of breath. Current antihyperlipidemic treatment includes statins. The current treatment provides moderate improvement of lipids. There are no compliance problems.   Anxiety Presents for follow-up visit. Patient reports no chest pain, decreased concentration, depressed mood, excessive worry, irritability, nervous/anxious behavior, palpitations, panic, restlessness or shortness of breath.      Lab Results  Component Value Date   NA 137 06/28/2022   K 4.7 06/28/2022   CO2 24 01/14/2022   GLUCOSE 94 01/14/2022   BUN 14 06/28/2022   CREATININE 0.9 06/28/2022   CALCIUM 9.4 06/28/2022   EGFR 85 01/14/2022   GFRNONAA >60 04/07/2020   Lab Results  Component Value Date   CHOL 188 06/28/2022    HDL 66 06/28/2022   LDLCALC 95 06/28/2022   TRIG 157 06/28/2022   CHOLHDL 3.2 08/27/2021   Lab Results  Component Value Date   TSH 2.57 06/28/2022   No results found for: "HGBA1C" Lab Results  Component Value Date   WBC 4.2 06/28/2022   HGB 15.0 06/28/2022   HCT 44 06/28/2022   MCV 92 01/14/2022   PLT 205 06/28/2022   Lab Results  Component Value Date   ALT 17 06/28/2022   AST 16 06/28/2022   ALKPHOS 61 06/28/2022   BILITOT 0.6 01/14/2022   No results found for: "25OHVITD2", "25OHVITD3", "VD25OH"   Review of Systems  Constitutional:  Negative for chills, fever, irritability and malaise/fatigue.  HENT:  Negative for trouble swallowing.   Eyes:  Negative for blurred vision and visual disturbance.  Respiratory:  Positive for wheezing. Negative for choking, chest tightness and shortness of breath.   Cardiovascular:  Negative for chest pain, palpitations, orthopnea, leg swelling and PND.  Gastrointestinal:  Negative for abdominal pain and blood in stool.  Endocrine: Negative for polydipsia and polyuria.  Genitourinary:  Negative for difficulty urinating, hematuria, menstrual problem, vaginal bleeding and vaginal discharge.  Musculoskeletal:  Negative for neck pain.  Neurological:  Negative for headaches.  Psychiatric/Behavioral:  Negative for decreased concentration. The patient is not nervous/anxious.     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   Bacterial upper respiratory infection 04/18/2014  Allergies  Allergen Reactions   Sulfa Antibiotics Hives    As a child   Sulfamethoxazole-Trimethoprim Hives and Other (See Comments)    Past Surgical History:  Procedure Laterality Date    BREAST SURGERY     BUNIONECTOMY     CESAREAN SECTION  2013   CESAREAN SECTION WITH BILATERAL TUBAL LIGATION Bilateral 05/25/2016   Procedure: REPEAT CESAREAN SECTION WITH BILATERAL TUBAL LIGATION;  Surgeon: Nadara Mustard, MD;  Location: [facility] ORS;  Service: Obstetrics;  Laterality: Bilateral;  Female @ 1550 Apgars: 8/9 Weight: 7lb  2oz   LAPAROSCOPY  2007   RADIOACTIVE SEED GUIDED EXCISIONAL BREAST BIOPSY Right 04/10/2020   Procedure: RADIOACTIVE SEED GUIDED EXCISIONAL RIGHT BREAST BIOPSY;  Surgeon: Emelia Loron, MD;  Location: [facility] OR;  Service: General;  Laterality: Right;    Social History   Tobacco Use   Smoking status: Never   Smokeless tobacco: Never  Vaping Use   Vaping status: Never Used  Substance Use Topics   Alcohol use: Yes    Alcohol/week: 0.0 standard drinks of alcohol    Comment: occasional social drinker   Drug use: No     Medication list has been reviewed and updated.  Current Meds  Medication Sig   atorvastatin (LIPITOR) 20 MG tablet Take 1 tablet (20 mg total) by mouth daily. TAKE 1 TABLET(10 MG) BY MOUTH DAILY   cetirizine (ZYRTEC) 10 MG tablet Take 10 mg by mouth daily as needed for allergies.   fluticasone (FLONASE) 50 MCG/ACT nasal spray Place 2 sprays into both nostrils daily.   hydrochlorothiazide (HYDRODIURIL) 12.5 MG tablet Take 1 tablet daily   meclizine (ANTIVERT) 25 MG tablet Take 1 tablet (25 mg total) by mouth 3 (three) times daily as needed for dizziness.   ondansetron (ZOFRAN) 4 MG tablet Take 1 tablet (4 mg total) by mouth every 8 (eight) hours as needed for nausea or vomiting.   sertraline (ZOLOFT) 25 MG tablet Take 1 tablet (25 mg total) by mouth daily.       03/24/2023    9:46 AM 02/24/2023    1:35 PM 12/23/2022    1:24 PM 12/09/2022   11:15 AM  GAD 7 : Generalized Anxiety Score  Nervous, Anxious, on Edge 0 0 0 1  Control/stop worrying 0 0 0 1  Worry too much - different things 0 0 0 1  Trouble relaxing 0 0 0 0  Restless 0  0 0 0  Easily annoyed or irritable 0 0 0 0  Afraid - awful might happen 0 0 0 0  Total GAD 7 Score 0 0 0 3  Anxiety Difficulty Not difficult at all Not difficult at all Not difficult at all Not difficult at all       03/24/2023    9:45 AM 02/24/2023    1:35 PM 12/23/2022    1:24 PM  Depression screen PHQ 2/9  Decreased Interest 0 0 0  Down, Depressed, Hopeless 0 0 0  PHQ - 2 Score 0 0 0  Altered sleeping 0 0 0  Tired, decreased energy 0 0 0  Change in appetite 0 0 0  Feeling bad or failure about yourself  0 0 0  Trouble concentrating 0 0 0  Moving slowly or fidgety/restless 0 0 0  Suicidal thoughts 0 0 0  PHQ-9 Score 0 0 0  Difficult doing work/chores Not difficult at all Not difficult at all Not difficult at all    BP Readings from Last 3 Encounters:  03/24/23  124/82  02/24/23 126/80  12/23/22 125/70    Physical Exam Vitals and nursing note reviewed.  Constitutional:      General: She is not in acute distress.    Appearance: She is not diaphoretic.  HENT:     Head: Normocephalic and atraumatic.     Right Ear: Tympanic membrane and external ear normal.     Left Ear: Tympanic membrane and external ear normal.     Nose: Nose normal.     Mouth/Throat:     Mouth: Mucous membranes are moist.  Eyes:     General:        Right eye: No discharge.        Left eye: No discharge.     Conjunctiva/sclera: Conjunctivae normal.     Pupils: Pupils are equal, round, and reactive to light.  Neck:     Thyroid: No thyromegaly.     Vascular: No JVD.  Cardiovascular:     Rate and Rhythm: Normal rate and regular rhythm.     Heart sounds: Normal heart sounds. No murmur heard.    No friction rub. No gallop.  Pulmonary:     Effort: Pulmonary effort is normal.     Breath sounds: Normal breath sounds. No wheezing, rhonchi or rales.  Abdominal:     General: Bowel sounds are normal.     Palpations: Abdomen is soft. There is no mass.     Tenderness: There is no abdominal tenderness.  There is no guarding.  Musculoskeletal:        General: Normal range of motion.     Cervical back: Normal range of motion and neck supple.  Lymphadenopathy:     Cervical: No cervical adenopathy.  Skin:    General: Skin is warm and dry.  Neurological:     Mental Status: She is alert.     Deep Tendon Reflexes: Reflexes are normal and symmetric.     Wt Readings from Last 3 Encounters:  03/24/23 141 lb 12.8 oz (64.3 kg)  02/24/23 145 lb 9.6 oz (66 kg)  12/23/22 154 lb 6.4 oz (70 kg)    BP 124/82   Pulse 78   Resp 16   Ht 5\' 4"  (1.626 m)   Wt 141 lb 12.8 oz (64.3 kg)   LMP 03/03/2023   SpO2 98%   BMI 24.34 kg/m   Assessment and Plan: 1. Essential hypertension (Primary) Chronic.  Controlled.  Stable.  Blood pressure today is 124/82.  Asymptomatic.  Tolerating medication well.  Will continue hydrochlorothiazide 12.5 mg once a day.  Patient has had labs within the designated time and we will hold today and will recheck on next visit.  Will recheck patient in 6 months. - hydrochlorothiazide (HYDRODIURIL) 12.5 MG tablet; Take 1 tablet daily  Dispense: 90 tablet; Refill: 1  2. Mixed hyperlipidemia Chronic.  Controlled.  Stable.  Asymptomatic.  Without muscle aches myalgias or muscle weakness.  Continue atorvastatin 20 mg once a day.  And will recheck patient in 6 months. - atorvastatin (LIPITOR) 20 MG tablet; Take 1 tablet (20 mg total) by mouth daily. TAKE 1 TABLET(10 MG) BY MOUTH DAILY  Dispense: 90 tablet; Refill: 1  3. Anxiety disorder due to known physiological condition Chronic.  Controlled.  Stable.  PHQ was 0 GAD score 0.  Patient is tolerating current dosing of sertraline 25 mg once a day and this will be continued.  Will recheck patient in 6 months.  Or sooner as needed. - sertraline (ZOLOFT) 25 MG  tablet; Take 1 tablet (25 mg total) by mouth daily.  Dispense: 90 tablet; Refill: 1     Elizabeth Sauer, MD

## 2023-05-17 ENCOUNTER — Ambulatory Visit: Payer: Self-pay

## 2023-05-17 NOTE — Telephone Encounter (Signed)
 Copied from CRM [id number]. Topic: Clinical - Red Word Triage >> May 17, 2023  1:23 PM Elle L wrote: Red Word that prompted transfer to Nurse Triage: The patient has been having throat pain, chest congestion, and a productive cough.  Chief Complaint: cough, runny nose, sore throat, congestion Symptoms: see above Frequency: since yesterday Pertinent Negatives: Patient denies difficulty breathing, cp Disposition: [] ED /[] Urgent Care (no appt availability in office) / [] Appointment(In office/virtual)/ []  [facility]/ [] Home Care/ [] Refused Recommended Disposition /[] [facility]/ [x]  Follow-up with PCP Additional Notes: declined apt with another provider; wants pcp to know of her symptoms.  Care advice given, denies questions; instructed to go to ER if becomes worse.   Reason for Disposition  [1] MILD difficulty breathing (e.g., minimal/no SOB at rest, SOB with walking, pulse <100) AND [2] still present when not coughing  Answer Assessment - Initial Assessment Questions 1. ONSET: "When did the cough begin?"      yesterday 2. SEVERITY: "How bad is the cough today?"      Moderate to severe 3. SPUTUM: "Describe the color of your sputum" (none, dry cough; clear, white, yellow, green)     no 4. HEMOPTYSIS: "Are you coughing up any blood?" If so ask: "How much?" (flecks, streaks, tablespoons, etc.)     no 5. DIFFICULTY BREATHING: "Are you having difficulty breathing?" If Yes, ask: "How bad is it?" (e.g., mild, moderate, severe)    - MILD: No SOB at rest, mild SOB with walking, speaks normally in sentences, can lie down, no retractions, pulse < 100.    - MODERATE: SOB at rest, SOB with minimal exertion and prefers to sit, cannot lie down flat, speaks in phrases, mild retractions, audible wheezing, pulse 100-120.    - SEVERE: Very SOB at rest, speaks in single words, struggling to breathe, sitting hunched forward, retractions, pulse > 120      no 6. FEVER: "Do you have a fever?"  If Yes, ask: "What is your temperature, how was it measured, and when did it start?"     Two days ago  7. CARDIAC HISTORY: "Do you have any history of heart disease?" (e.g., heart attack, congestive heart failure)      no 8. LUNG HISTORY: "Do you have any history of lung disease?"  (e.g., pulmonary embolus, asthma, emphysema)     no 9. PE RISK FACTORS: "Do you have a history of blood clots?" (or: recent major surgery, recent prolonged travel, bedridden)     no 10. OTHER SYMPTOMS: "Do you have any other symptoms?" (e.g., runny nose, wheezing, chest pain)       Sore throat, runny nose, wheezing 11. PREGNANCY: "Is there any chance you are pregnant?" "When was your last menstrual period?"       na 12. TRAVEL: "Have you traveled out of the country in the last month?" (e.g., travel history, exposures)       no  Protocols used: Cough - Acute Productive-A-AH

## 2023-05-17 NOTE — Telephone Encounter (Signed)
 Please call pt let her know she will need to be seen. Dr. Rochelle Chu is aware and states she needs to be seen as well.  KP

## 2023-05-18 ENCOUNTER — Ambulatory Visit: Admitting: Family Medicine

## 2023-05-18 ENCOUNTER — Ambulatory Visit
Admission: RE | Admit: 2023-05-18 | Discharge: 2023-05-18 | Disposition: A | Discharge status: Home / Self Care | Attending: Family Medicine | Admitting: Family Medicine

## 2023-05-18 ENCOUNTER — Encounter: Payer: Self-pay | Admitting: Family Medicine

## 2023-05-18 ENCOUNTER — Ambulatory Visit
Admission: RE | Admit: 2023-05-18 | Discharge: 2023-05-18 | Disposition: A | Discharge status: Home / Self Care | Source: Ambulatory Visit | Attending: Family Medicine | Admitting: Family Medicine

## 2023-05-18 VITALS — BP 118/92 | HR 71 | Temp 97.7°F | Ht 64.0 in | Wt 142.0 lb

## 2023-05-18 DIAGNOSIS — J189 Pneumonia, unspecified organism: Secondary | ICD-10-CM

## 2023-05-18 MED ORDER — AMOXICILLIN 500 MG PO CAPS: 500.0000 mg | ORAL_CAPSULE | Freq: Three times a day (TID) | ORAL | 0 refills | Status: AC

## 2023-05-18 MED ORDER — AZITHROMYCIN 250 MG PO TABS: ORAL_TABLET | ORAL | 0 refills | Status: AC

## 2023-05-18 NOTE — Progress Notes (Signed)
 Patient: Denise Clarke, Female    DOB: 08/17/1980, 43 y.o.   MRN: [id number] Visit Date: 05/18/2023  Today's Provider: Alayne Allis, MD   Chief Complaint  Patient presents with   Cough    X4 days, feels like its in chest, started with a sore throat, dry cough, runny nose, had fever of 100 on Monday last 1 day, tried nyquil and mucinex , helps some   Subjective:    ----------------------------------------------------------- Cough This is a chronic problem. The current episode started more than 1 year ago. The problem has been waxing and waning. The cough is Productive of purulent sputum. Associated symptoms include a fever, nasal congestion, a sore throat and sweats. Pertinent negatives include no chest pain, chills, ear pain, eye redness, headaches, hemoptysis, postnasal drip, shortness of breath or wheezing.  Sinusitis This is a new problem. The current episode started in the past 7 days. The problem has been gradually worsening since onset. The fever has been present for 5 days or more. The pain is moderate. Associated symptoms include congestion, coughing, diaphoresis and a sore throat. Pertinent negatives include no chills, ear pain, headaches, hoarse voice, neck pain, shortness of breath, sinus pressure, sneezing or swollen glands. Treatments tried: mucinexand nasal steroid. The treatment provided no relief.    Lab Results  Component Value Date   CREATININE 0.9 06/28/2022   BUN 14 06/28/2022   NA 137 06/28/2022   K 4.7 06/28/2022   CL 97 (A) 06/28/2022   CO2 24 01/14/2022   Lab Results  Component Value Date   CHOL 188 06/28/2022   HDL 66 06/28/2022   LDLCALC 95 06/28/2022   TRIG 157 06/28/2022   CHOLHDL 3.2 08/27/2021   Lab Results  Component Value Date   TSH 2.57 06/28/2022   No results found for: "HGBA1C" Lab Results  Component Value Date   WBC 4.2 06/28/2022   HGB 15.0 06/28/2022   HCT 44 06/28/2022   MCV 92 01/14/2022   PLT 205 06/28/2022   Lab Results   Component Value Date   ALT 17 06/28/2022   AST 16 06/28/2022   ALKPHOS 61 06/28/2022   BILITOT 0.6 01/14/2022    Review of Systems  Constitutional:  Positive for diaphoresis and fever. Negative for chills.  HENT:  Positive for congestion and sore throat. Negative for ear pain, hoarse voice, postnasal drip, sinus pressure and sneezing.   Eyes:  Negative for photophobia, redness and visual disturbance.  Respiratory:  Positive for cough. Negative for hemoptysis, chest tightness, shortness of breath and wheezing.   Cardiovascular:  Negative for chest pain, palpitations and leg swelling.  Musculoskeletal:  Negative for neck pain.  Neurological:  Negative for headaches.    Social History   Socioeconomic History   Marital status: Married    Spouse name: Not on file   Number of children: Not on file   Years of education: Not on file   Highest education level: Associate degree: occupational, Scientist, product/process development, or vocational program  Occupational History   Not on file  Tobacco Use   Smoking status: Never   Smokeless tobacco: Never  Vaping Use   Vaping status: Never Used  Substance and Sexual Activity   Alcohol use: Yes    Alcohol/week: 0.0 standard drinks of alcohol    Comment: occasional social drinker   Drug use: No   Sexual activity: Yes    Birth control/protection: Surgical  Other Topics Concern   Not on file  Social History Narrative   Not on  file   Social Drivers of Health   Financial Resource Strain: Low Risk  (03/24/2023)   Overall Financial Resource Strain (CARDIA)    Difficulty of Paying Living Expenses: Not hard at all  Food Insecurity: No Food Insecurity (03/24/2023)   Hunger Vital Sign    Worried About Running Out of Food in the Last Year: Never true    Ran Out of Food in the Last Year: Never true  Transportation Needs: No Transportation Needs (03/24/2023)   PRAPARE - Administrator, Civil Service (Medical): No    Lack of Transportation (Non-Medical): No   Physical Activity: Insufficiently Active (03/24/2023)   Exercise Vital Sign    Days of Exercise per Week: 4 days    Minutes of Exercise per Session: 30 min  Stress: No Stress Concern Present (03/24/2023)   [employer] of Occupational Health - Occupational Stress Questionnaire    Feeling of Stress : Only a little  Social Connections: Moderately Isolated (03/24/2023)   Social Connection and Isolation Panel [NHANES]    Frequency of Communication with Friends and Family: More than three times a week    Frequency of Social Gatherings with Friends and Family: More than three times a week    Attends Religious Services: Never    Database administrator or Organizations: No    Attends Engineer, structural: Not on file    Marital Status: Married  Catering manager Violence: Not At Risk (05/18/2023)   Humiliation, Afraid, Rape, and Kick questionnaire    Fear of Current or Ex-Partner: No    Emotionally Abused: No    Physically Abused: No    Sexually Abused: No    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   Bacterial upper respiratory infection 04/18/2014    Past Surgical History:  Procedure Laterality Date   BREAST SURGERY     BUNIONECTOMY     CESAREAN SECTION  2013   CESAREAN SECTION WITH BILATERAL TUBAL LIGATION Bilateral 05/25/2016   Procedure: REPEAT CESAREAN SECTION WITH BILATERAL TUBAL LIGATION;  Surgeon: Alben Alma, MD;  Location: [facility] ORS;  Service: Obstetrics;  Laterality: Bilateral;  Female @ 1550 Apgars: 8/9 Weight: 7lb  2oz   LAPAROSCOPY  2007   RADIOACTIVE SEED GUIDED EXCISIONAL BREAST BIOPSY Right 04/10/2020   Procedure: RADIOACTIVE SEED GUIDED EXCISIONAL RIGHT BREAST  BIOPSY;  Surgeon: Enid Harry, MD;  Location: [facility] OR;  Service: General;  Laterality: Right;    Her family history includes Breast cancer in her maternal aunt; Breast cancer (age of onset: 32) in her maternal grandmother; Cancer in her brother; Diabetes in her father; Hypertension in her father.     Current Meds  Medication Sig   atorvastatin  (LIPITOR) 20 MG tablet Take 1 tablet (20 mg total) by mouth daily. TAKE 1 TABLET(10 MG) BY MOUTH DAILY   cetirizine (ZYRTEC) 10 MG tablet Take 10 mg by mouth daily as needed for allergies.   fluticasone  (FLONASE ) 50 MCG/ACT nasal spray Place 2 sprays into both nostrils daily.   hydrochlorothiazide  (HYDRODIURIL ) 12.5 MG tablet Take 1 tablet daily   ondansetron  (ZOFRAN ) 4 MG tablet Take 1 tablet (4 mg total) by mouth every 8 (eight) hours as needed for nausea or vomiting.   sertraline  (ZOLOFT )  25 MG tablet Take 1 tablet (25 mg total) by mouth daily.   [DISCONTINUED] meclizine  (ANTIVERT ) 25 MG tablet Take 1 tablet (25 mg total) by mouth 3 (three) times daily as needed for dizziness.    Patient Care Team: Clarise Crooks, MD as PCP - General (Family Medicine)       Objective:   Wt Readings from Last 3 Encounters:  05/18/23 142 lb (64.4 kg)  03/24/23 141 lb 12.8 oz (64.3 kg)  02/24/23 145 lb 9.6 oz (66 kg)    Vitals: BP (!) 118/92   Pulse 71   Temp 97.7 F (36.5 C)   Ht 5\' 4"  (1.626 m)   Wt 142 lb (64.4 kg)   SpO2 97%   BMI 24.37 kg/m   Physical Exam Vitals and nursing note reviewed.  HENT:     Right Ear: Tympanic membrane and ear canal normal.     Left Ear: Tympanic membrane and ear canal normal.     Nose: No congestion or rhinorrhea.     Mouth/Throat:     Mouth: Mucous membranes are moist.     Pharynx: No oropharyngeal exudate or posterior oropharyngeal erythema.  Eyes:     Pupils: Pupils are equal, round, and reactive to light.  Cardiovascular:     Rate and Rhythm: Normal rate and regular rhythm.     Heart sounds: No  murmur heard.    No friction rub. No gallop.  Pulmonary:     Breath sounds: Examination of the right-lower field reveals decreased breath sounds and rales. Decreased breath sounds and rales present. No wheezing or rhonchi.  Abdominal:     General: There is no distension.     Palpations: There is no mass.     Tenderness: There is no guarding.  Musculoskeletal:     Cervical back: Normal range of motion.  Neurological:     Mental Status: She is alert.     Activities of Daily Living     No data to display          Fall Risk Assessment    03/24/2023    9:45 AM 02/24/2023    1:34 PM 12/23/2022    1:23 PM 12/09/2022   11:15 AM 10/14/2022    8:15 AM  Fall Risk   Falls in the past year? 0 0 0 0 0  Number falls in past yr: 0 0 0 0 0  Injury with Fall? 0 0 0 0   Risk for fall due to :  No Fall Risks No Fall Risks No Fall Risks   Follow up  Falls evaluation completed Falls evaluation completed Falls evaluation completed      Depression Screen    05/18/2023    9:00 AM 03/24/2023    9:45 AM 02/24/2023    1:35 PM 12/23/2022    1:24 PM  PHQ 2/9 Scores  PHQ - 2 Score 0 0 0 0  PHQ- 9 Score  0 0 0      ------------------------------------------------------------------------------------------------------------  Assessment & Plan:    1. Pneumonia of right middle lobe due to infectious organism (Primary) New onset.  Onset around Mother's Day settling in the chest in the past week.  Patient with slightly productive cough fever earlier in the week.  Without shortness of breath.  Patient had a similar episode earlier in the year.pulse ox in normal range.  We will treat as a CAP and will use combined antibiotic amoxicillin 500 mg 3 times a day and azithromycin  250  mg dosing 2 today followed by 1 a day for 4 days.  Will recheck with chest x-ray in 6 weeks or sooner if necessary. - DG Chest 2 View - amoxicillin (AMOXIL) 500 MG capsule; Take 1 capsule (500 mg total) by mouth 3 (three) times  daily for 10 days.  Dispense: 30 capsule; Refill: 0 - azithromycin  (ZITHROMAX ) 250 MG tablet; Take 2 tablets on day 1, then 1 tablet daily on days 2 through 5  Dispense: 6 tablet; Refill: 0   Chest x-ray noted a focal area of atelectasis opacity in the lower left lung zone and we will repeat chest x-ray to follow-up on this.

## 2023-09-21 ENCOUNTER — Ambulatory Visit: Payer: Self-pay

## 2023-09-21 ENCOUNTER — Telehealth: Payer: Self-pay

## 2023-09-21 NOTE — Telephone Encounter (Signed)
 FYI Only or Action Required?: FYI only for provider. Former patient of Dr. Elita appointment scheduled for tomorrow at 8 AM. [facility] appointment scheduled for 11/03/2023 at 9 AM.  Patient was last seen in primary care on 05/18/2023 by Joshua Cathryne BROCKS, MD.  Called Nurse Triage reporting Abdominal Pain.  Symptoms began several days ago.  Interventions attempted: Rest, hydration, or home remedies.  Symptoms are: unchanged.  Triage Disposition: See Physician Within 24 Hours  Patient/caregiver understands and will follow disposition?: Yes  Copied from CRM [id number]. Topic: Clinical - Red Word Triage >> Sep 21, 2023  2:10 PM Selinda RAMAN wrote: Red Word that prompted transfer to Nurse Triage: The patient called in stating she has been having sharpe abdominal pains and vomiting since Sunday. She says it is her upper right abdominal area. I will transfer her to E2C2 NT Reason for Disposition  [1] MILD pain (e.g., does not interfere with normal activities) AND [2] comes and goes (cramps) AND [3] present > 72 hours  (Exception: This same abdominal pain is a chronic symptom recurrent or ongoing AND present > 4 weeks.)  Answer Assessment - Initial Assessment Questions 1. LOCATION: Where does it hurt?      Right upper abdominal area 2. RADIATION: Does the pain shoot anywhere else? (e.g., chest, back)     Radiates to back 3. ONSET: When did the pain begin? (e.g., minutes, hours or days ago)      Sunday 4. SUDDEN: Gradual or sudden onset?     gradual 5. PATTERN Does the pain come and go, or is it constant?     intermittent 6. SEVERITY: How bad is the pain?  (e.g., Scale 1-10; mild, moderate, or severe)     3 out of 10 7. RECURRENT SYMPTOM: Have you ever had this type of stomach pain before? If Yes, ask: When was the last time? and What happened that time?      yes 8. AGGRAVATING FACTORS: Does anything seem to cause this pain? (e.g., foods, stress, alcohol)     food 9. CARDIAC  SYMPTOMS: Do you have any of the following symptoms: chest pain, difficulty breathing, sweating, nausea?     nausea 10. OTHER SYMPTOMS: Do you have any other symptoms? (e.g., back pain, diarrhea, fever, urination pain, vomiting)       Back pain, vomiting on Sunday but nothing since.  11. PREGNANCY: Is there any chance you are pregnant? When was your last menstrual period?       no  Protocols used: Abdominal Pain - Upper-A-AH

## 2023-09-21 NOTE — Telephone Encounter (Signed)
 Patient needs TOC for refill medications/

## 2023-09-21 NOTE — Telephone Encounter (Signed)
 Noted  Pt has appt.  KP

## 2023-09-22 ENCOUNTER — Encounter: Payer: Self-pay | Admitting: Student

## 2023-09-22 ENCOUNTER — Ambulatory Visit: Admitting: Student

## 2023-09-22 VITALS — BP 120/84 | HR 82 | Temp 97.9°F | Ht 64.0 in | Wt 144.4 lb

## 2023-09-22 DIAGNOSIS — R1011 Right upper quadrant pain: Secondary | ICD-10-CM | POA: Diagnosis not present

## 2023-09-22 DIAGNOSIS — F064 Anxiety disorder due to known physiological condition: Secondary | ICD-10-CM

## 2023-09-22 MED ORDER — SERTRALINE HCL 25 MG PO TABS: 25.0000 mg | ORAL_TABLET | Freq: Every day | ORAL | 0 refills | Status: DC

## 2023-09-22 NOTE — Assessment & Plan Note (Addendum)
 Acute onset about 4 days ago after have a heavier meal but has had chronic intermittent colicky pain. Suspect hepatobiliary etiology. Non toxic appearing on exam today without rebound or guarding. Tolerating PO intake. Reviewed ED precautions of fever, uncontrolled pain, jaundice, and inability to tolerate PO intake.  -CMP, CBC, lipase -RUQ US  -Advised eating smaller meals and avoiding fatty foods

## 2023-09-22 NOTE — Progress Notes (Signed)
 Established Patient Office Visit  Subjective   Patient ID: Denise Clarke, female    DOB: Jul 06, 1980  Age: 43 y.o. MRN: [id number]  Chief Complaint  Patient presents with   Abdominal Pain    Abdominal pain x 1 week. Vomiting on Sunday 09/17/23, stomach pain upper right quad (sharp) not constant notices pain more after eating.     Denise Clarke is a 45 year older person who presents today for abdominal pain for the last 4 days. Had vomiting in the night after having [place] food with a large amount of cheese. Reports multiple episodes of non bilious non bloody emesis. Since then has had RUQ pain that comes and goes for 30 minutes at a time. Has had milder RUQ intermittently for the last several years.  Describes pain as stabbing that is worse after eating. Has had some nausea since this but tolerating PO intake well. Has not taken anything oral for pain. Uses a muscle rub with no difference. She denies fevers, chills, jaundice, constipation, diarrhea, or urinary symptoms. Has 1-2 alcoholic drinks a week.  Patient Active Problem List   Diagnosis Date Noted   RUQ pain 09/22/2023   History of cervical dysplasia 11/11/2016   Hypertension, postpartum condition or complication 05/27/2016   History of classical cesarean section 05/25/2016   History of cesarean section, classical 03/11/2016   Anxiety disorder due to known physiological condition 04/18/2014   Bacterial upper respiratory infection 04/18/2014      ROS Refer to HPI    Objective:     Outpatient Encounter Medications as of 09/22/2023  Medication Sig   atorvastatin  (LIPITOR) 20 MG tablet Take 1 tablet (20 mg total) by mouth daily. TAKE 1 TABLET(10 MG) BY MOUTH DAILY   cetirizine (ZYRTEC) 10 MG tablet Take 10 mg by mouth daily as needed for allergies.   fluticasone  (FLONASE ) 50 MCG/ACT nasal spray Place 2 sprays into both nostrils daily.   hydrochlorothiazide  (HYDRODIURIL ) 12.5 MG tablet Take 1 tablet daily    [DISCONTINUED] sertraline  (ZOLOFT ) 25 MG tablet Take 1 tablet (25 mg total) by mouth daily.   ondansetron  (ZOFRAN ) 4 MG tablet Take 1 tablet (4 mg total) by mouth every 8 (eight) hours as needed for nausea or vomiting.   sertraline  (ZOLOFT ) 25 MG tablet Take 1 tablet (25 mg total) by mouth daily.   No facility-administered encounter medications on file as of 09/22/2023.    BP 120/84   Pulse 82   Temp 97.9 F (36.6 C)   Ht 5' 4 (1.626 m)   Wt 144 lb 6.4 oz (65.5 kg)   SpO2 98%   BMI 24.79 kg/m  BP Readings from Last 3 Encounters:  09/22/23 120/84  05/18/23 (!) 118/92  03/24/23 124/82    Physical Exam Constitutional:      General: She is not in acute distress.    Appearance: Normal appearance.  HENT:     Head: Normocephalic and atraumatic.     Mouth/Throat:     Mouth: Mucous membranes are moist.     Pharynx: Oropharynx is clear.  Eyes:     General: No scleral icterus.    Extraocular Movements: Extraocular movements intact.     Pupils: Pupils are equal, round, and reactive to light.  Cardiovascular:     Rate and Rhythm: Normal rate and regular rhythm.  Pulmonary:     Effort: Pulmonary effort is normal. No respiratory distress.     Breath sounds: No rhonchi or rales.  Abdominal:  General: Abdomen is flat. Bowel sounds are normal. There is no distension.     Palpations: Abdomen is rigid. There is no shifting dullness, fluid wave, hepatomegaly or mass.     Tenderness: There is abdominal tenderness (mild with deep palpation) in the right upper quadrant. There is no right CVA tenderness, left CVA tenderness, guarding or rebound. Negative signs include Murphy's sign.     Hernia: No hernia is present.  Musculoskeletal:        General: Normal range of motion.     Right lower leg: No edema.     Left lower leg: No edema.  Skin:    General: Skin is warm and dry.     Capillary Refill: Capillary refill takes less than 2 seconds.     Coloration: Skin is not jaundiced.   Neurological:     General: No focal deficit present.     Mental Status: She is alert and oriented to person, place, and time.  Psychiatric:        Mood and Affect: Mood normal.        Behavior: Behavior normal.        09/22/2023    8:09 AM 05/18/2023    9:00 AM 03/24/2023    9:45 AM  Depression screen PHQ 2/9  Decreased Interest 0 0 0  Down, Depressed, Hopeless 0 0 0  PHQ - 2 Score 0 0 0  Altered sleeping   0  Tired, decreased energy   0  Change in appetite   0  Feeling bad or failure about yourself    0  Trouble concentrating   0  Moving slowly or fidgety/restless   0  Suicidal thoughts   0  PHQ-9 Score   0  Difficult doing work/chores   Not difficult at all       05/18/2023    9:00 AM 03/24/2023    9:46 AM 02/24/2023    1:35 PM 12/23/2022    1:24 PM  GAD 7 : Generalized Anxiety Score  Nervous, Anxious, on Edge 0 0 0 0  Control/stop worrying 0 0 0 0  Worry too much - different things 0 0 0 0  Trouble relaxing 0 0 0 0  Restless 0 0 0 0  Easily annoyed or irritable 0 0 0 0  Afraid - awful might happen 0 0 0 0  Total GAD 7 Score 0 0 0 0  Anxiety Difficulty Not difficult at all Not difficult at all Not difficult at all Not difficult at all    No results found for any visits on 09/22/23.    The 10-year ASCVD risk score (Arnett DK, et al., 2019) is: 0.6%* (Cholesterol units were assumed)    Assessment & Plan:  RUQ pain Assessment & Plan: Acute onset about 4 days ago after have a heavier meal but has had chronic intermittent colicky pain. Suspect hepatobiliary etiology. Non toxic appearing on exam today without rebound or guarding. Tolerating PO intake. Reviewed ED precautions of fever, uncontrolled pain, jaundice, and inability to tolerate PO intake.  -CMP, CBC, lipase -RUQ US  -Advised eating smaller meals and avoiding fatty foods   Orders: -     CBC with Differential/Platelet -     Comprehensive metabolic panel with GFR -     Lipase -     US  ABDOMEN LIMITED  RUQ (LIVER/GB); Future  Anxiety disorder due to known physiological condition Assessment & Plan: Well controlled, refilled zoloft  25 mg daily.   Orders: -  Sertraline  HCl; Take 1 tablet (25 mg total) by mouth daily.  Dispense: 90 tablet; Refill: 0     Return if symptoms worsen or fail to improve.    Harlene Saddler, MD

## 2023-09-22 NOTE — Assessment & Plan Note (Signed)
 Well controlled, refilled zoloft  25 mg daily.

## 2023-09-23 LAB — COMPREHENSIVE METABOLIC PANEL WITH GFR
ALT: 17 IU/L (ref 0–32)
AST: 18 IU/L (ref 0–40)
Albumin: 4.8 g/dL (ref 3.9–4.9)
Alkaline Phosphatase: 65 IU/L (ref 41–116)
BUN/Creatinine Ratio: 15 (ref 9–23)
BUN: 12 mg/dL (ref 6–24)
Bilirubin Total: 0.6 mg/dL (ref 0.0–1.2)
CO2: 26 mmol/L (ref 20–29)
Calcium: 9.6 mg/dL (ref 8.7–10.2)
Chloride: 100 mmol/L (ref 96–106)
Creatinine, Ser: 0.79 mg/dL (ref 0.57–1.00)
Globulin, Total: 2.3 g/dL (ref 1.5–4.5)
Glucose: 102 mg/dL — ABNORMAL HIGH (ref 70–99)
Potassium: 4.7 mmol/L (ref 3.5–5.2)
Sodium: 139 mmol/L (ref 134–144)
Total Protein: 7.1 g/dL (ref 6.0–8.5)
eGFR: 95 mL/min/1.73 (ref >59)

## 2023-09-23 LAB — CBC WITH DIFFERENTIAL/PLATELET
Basophils Absolute: 0 x10E3/uL (ref 0.0–0.2)
Basos: 1 %
EOS (ABSOLUTE): 0.1 x10E3/uL (ref 0.0–0.4)
Eos: 3 %
Hematocrit: 48.7 % — ABNORMAL HIGH (ref 34.0–46.6)
Hemoglobin: 15.9 g/dL (ref 11.1–15.9)
Immature Grans (Abs): 0 x10E3/uL (ref 0.0–0.1)
Immature Granulocytes: 0 %
Lymphocytes Absolute: 0.9 x10E3/uL (ref 0.7–3.1)
Lymphs: 20 %
MCH: 30.3 pg (ref 26.6–33.0)
MCHC: 32.6 g/dL (ref 31.5–35.7)
MCV: 93 fL (ref 79–97)
Monocytes Absolute: 0.3 x10E3/uL (ref 0.1–0.9)
Monocytes: 8 %
Neutrophils Absolute: 3 x10E3/uL (ref 1.4–7.0)
Neutrophils: 68 %
Platelets: 200 x10E3/uL (ref 150–450)
RBC: 5.24 x10E6/uL (ref 3.77–5.28)
RDW: 12.1 % (ref 11.7–15.4)
WBC: 4.4 x10E3/uL (ref 3.4–10.8)

## 2023-09-23 LAB — LIPASE: Lipase: 43 U/L (ref 14–72)

## 2023-09-25 ENCOUNTER — Ambulatory Visit: Payer: Self-pay | Admitting: Student

## 2023-09-29 ENCOUNTER — Ambulatory Visit
Admission: RE | Admit: 2023-09-29 | Discharge: 2023-09-29 | Disposition: A | Discharge status: Home / Self Care | Source: Ambulatory Visit | Attending: Student | Admitting: Student

## 2023-09-29 DIAGNOSIS — R1011 Right upper quadrant pain: Secondary | ICD-10-CM | POA: Insufficient documentation

## 2023-09-29 NOTE — Telephone Encounter (Signed)
 Please review patient's response.

## 2023-11-03 ENCOUNTER — Ambulatory Visit: Admitting: Student

## 2023-11-03 VITALS — BP 124/80 | HR 86 | Ht 64.0 in | Wt 148.0 lb

## 2023-11-03 DIAGNOSIS — E785 Hyperlipidemia, unspecified: Secondary | ICD-10-CM | POA: Insufficient documentation

## 2023-11-03 DIAGNOSIS — E782 Mixed hyperlipidemia: Secondary | ICD-10-CM

## 2023-11-03 DIAGNOSIS — I10 Essential (primary) hypertension: Secondary | ICD-10-CM | POA: Diagnosis not present

## 2023-11-03 DIAGNOSIS — Z9851 Tubal ligation status: Secondary | ICD-10-CM | POA: Insufficient documentation

## 2023-11-03 DIAGNOSIS — Z1159 Encounter for screening for other viral diseases: Secondary | ICD-10-CM

## 2023-11-03 DIAGNOSIS — T7840XD Allergy, unspecified, subsequent encounter: Secondary | ICD-10-CM

## 2023-11-03 DIAGNOSIS — Z87410 Personal history of cervical dysplasia: Secondary | ICD-10-CM

## 2023-11-03 DIAGNOSIS — R053 Chronic cough: Secondary | ICD-10-CM | POA: Diagnosis not present

## 2023-11-03 DIAGNOSIS — F064 Anxiety disorder due to known physiological condition: Secondary | ICD-10-CM | POA: Diagnosis not present

## 2023-11-03 DIAGNOSIS — T7840XA Allergy, unspecified, initial encounter: Secondary | ICD-10-CM | POA: Insufficient documentation

## 2023-11-03 MED ORDER — HYDROCHLOROTHIAZIDE 12.5 MG PO TABS: ORAL_TABLET | ORAL | 1 refills | Status: AC

## 2023-11-03 MED ORDER — SERTRALINE HCL 25 MG PO TABS: 25.0000 mg | ORAL_TABLET | Freq: Every day | ORAL | 1 refills | Status: AC

## 2023-11-03 MED ORDER — ATORVASTATIN CALCIUM 20 MG PO TABS: 20.0000 mg | ORAL_TABLET | Freq: Every day | ORAL | 1 refills | Status: DC

## 2023-11-03 NOTE — Assessment & Plan Note (Addendum)
 Last PAP 2023 HPV-, NILM, previous provider and [facility] no longer available. Reports she has been getting PAP smears every 2 years. She will try to establish with [facility] in [place]

## 2023-11-03 NOTE — Assessment & Plan Note (Addendum)
 This has been occurring for the past year or so. Typically at night and in the morning. Previously on lisinopril  but this has been stopped without improvement. Recently switched from zyrtec to xyzal with some improvement. Reports worsening sx when around her cat she has had for the last 4 years, better when she was on vacation to the beach without her cat. Also notes worsening sx with aspirin and NSAID use. Uses albuterol  with improvement in sx. Respiratory exam unremarkable on exam today. Sx suggestive of cough variant asthma and possible AERD. No eosinophilia on CBC in September. Will refer to allergy and asthma. Will send in airsupra for her.

## 2023-11-03 NOTE — Assessment & Plan Note (Addendum)
 Currently well controlled on hydrochlorothiazide  12.5 mg a day.  Stable kidney function labs in September. Continue current medications.

## 2023-11-03 NOTE — Assessment & Plan Note (Addendum)
 Was started on this after have panic attack follow birth of second child 7 years ago. Mood is stable today. GAD7 was 0 today. Reports lifelong hx of anxiety. Continue sertraline  25 mg daily.

## 2023-11-03 NOTE — Assessment & Plan Note (Signed)
 Currently using xyzal and flonase . Think she may be developing allergies to her cat. Will send her to allergy for evaluation.

## 2023-11-03 NOTE — Assessment & Plan Note (Addendum)
 Currently on atorvastatin  20 mg daily for cholesterol. Well controlled. Lipid panel today.

## 2023-11-03 NOTE — Patient Instructions (Signed)
  We hope you enjoyed your visit with our office! Your feedback means so much to our team, and it helps us  to continue providing the best care possible. If you had a positive experience, we'd love if you could share it by leaving us  a [employer] Review and also completing our patient survey that you'll receive soon.  Your kind words not only brighten our day but also help other patients feel confident in choosing our office for their care.  Thank you for being a part of our practice family!   Dr. [facility]  Primary Care & [facility] [address]  [place] [address] Office [phone]  Fax: [phone]'

## 2023-11-03 NOTE — Progress Notes (Signed)
 Established Patient Office Visit  Subjective   Patient ID: Denise Clarke, female    DOB: 1980-01-17  Age: 43 y.o. MRN: [id number]  No chief complaint on file.  Denise Clarke 43 y.o. person with medical hx listed below presents today for transfer of care.   Atpic ibupron ASA, cough wheezing   Patient Active Problem List   Diagnosis Date Noted   S/P tubal ligation 11/03/2023   HLD (hyperlipidemia) 11/03/2023   Chronic cough 11/03/2023   Allergies 11/03/2023   History of cervical dysplasia 11/11/2016   Hypertension 05/27/2016   Anxiety disorder due to known physiological condition 04/18/2014   Bacterial upper respiratory infection 04/18/2014      ROS Refer to HPI    Objective:     Outpatient Encounter Medications as of 11/03/2023  Medication Sig   fluticasone  (FLONASE ) 50 MCG/ACT nasal spray Place 2 sprays into both nostrils daily.   levocetirizine (XYZAL) 5 MG tablet Take 5 mg by mouth every evening.   [DISCONTINUED] atorvastatin  (LIPITOR) 20 MG tablet Take 1 tablet (20 mg total) by mouth daily. TAKE 1 TABLET(10 MG) BY MOUTH DAILY   [DISCONTINUED] hydrochlorothiazide  (HYDRODIURIL ) 12.5 MG tablet Take 1 tablet daily   [DISCONTINUED] ondansetron  (ZOFRAN ) 4 MG tablet Take 1 tablet (4 mg total) by mouth every 8 (eight) hours as needed for nausea or vomiting.   [DISCONTINUED] sertraline  (ZOLOFT ) 25 MG tablet Take 1 tablet (25 mg total) by mouth daily.   atorvastatin  (LIPITOR) 20 MG tablet Take 1 tablet (20 mg total) by mouth daily. TAKE 1 TABLET(10 MG) BY MOUTH DAILY   hydrochlorothiazide  (HYDRODIURIL ) 12.5 MG tablet Take 1 tablet daily   sertraline  (ZOLOFT ) 25 MG tablet Take 1 tablet (25 mg total) by mouth daily.   [DISCONTINUED] cetirizine (ZYRTEC) 10 MG tablet Take 10 mg by mouth daily as needed for allergies. (Patient not taking: Reported on 11/03/2023)   No facility-administered encounter medications on file as of 11/03/2023.    BP 124/80   Pulse 86   Ht 5'  4 (1.626 m)   Wt 148 lb (67.1 kg)   LMP 10/24/2023   SpO2 96%   BMI 25.40 kg/m  BP Readings from Last 3 Encounters:  11/03/23 124/80  09/22/23 120/84  05/18/23 (!) 118/92    Physical Exam Constitutional:      Appearance: Normal appearance.  HENT:     Head: Normocephalic and atraumatic.     Right Ear: Ear canal and external ear normal. Tympanic membrane is bulging.     Left Ear: Ear canal and external ear normal. Tympanic membrane is bulging.     Nose: Congestion present.     Mouth/Throat:     Mouth: Mucous membranes are moist.     Pharynx: Oropharynx is clear. No oropharyngeal exudate or posterior oropharyngeal erythema.  Eyes:     Extraocular Movements: Extraocular movements intact.     Pupils: Pupils are equal, round, and reactive to light.  Cardiovascular:     Rate and Rhythm: Normal rate and regular rhythm.  Pulmonary:     Effort: Pulmonary effort is normal.     Breath sounds: No rhonchi or rales.  Abdominal:     General: Abdomen is flat. Bowel sounds are normal. There is no distension.     Palpations: Abdomen is soft.     Tenderness: There is no abdominal tenderness.  Musculoskeletal:        General: Normal range of motion.     Right lower leg: No edema.  Left lower leg: No edema.  Skin:    General: Skin is warm and dry.     Capillary Refill: Capillary refill takes less than 2 seconds.  Neurological:     General: No focal deficit present.     Mental Status: She is alert and oriented to person, place, and time.  Psychiatric:        Mood and Affect: Mood normal.        Behavior: Behavior normal.        11/03/2023    9:40 AM 09/22/2023    8:09 AM 05/18/2023    9:00 AM  Depression screen PHQ 2/9  Decreased Interest 0 0 0  Down, Depressed, Hopeless 0 0 0  PHQ - 2 Score 0 0 0  Altered sleeping 2    Tired, decreased energy 2    Change in appetite 2    Feeling bad or failure about yourself  0    Trouble concentrating 0    Moving slowly or  fidgety/restless 0    Suicidal thoughts 0    PHQ-9 Score 6    Difficult doing work/chores Not difficult at all         11/03/2023    9:41 AM 05/18/2023    9:00 AM 03/24/2023    9:46 AM 02/24/2023    1:35 PM  GAD 7 : Generalized Anxiety Score  Nervous, Anxious, on Edge 0 0 0 0  Control/stop worrying 0 0 0 0  Worry too much - different things 0 0 0 0  Trouble relaxing 0 0 0 0  Restless 0 0 0 0  Easily annoyed or irritable 0 0 0 0  Afraid - awful might happen 0 0 0 0  Total GAD 7 Score 0 0 0 0  Anxiety Difficulty Not difficult at all Not difficult at all Not difficult at all Not difficult at all    No results found for any visits on 11/03/23.  Last CBC Lab Results  Component Value Date   WBC 4.4 09/22/2023   HGB 15.9 09/22/2023   HCT 48.7 (H) 09/22/2023   MCV 93 09/22/2023   MCH 30.3 09/22/2023   RDW 12.1 09/22/2023   PLT 200 09/22/2023   Last metabolic panel Lab Results  Component Value Date   GLUCOSE 102 (H) 09/22/2023   NA 139 09/22/2023   K 4.7 09/22/2023   CL 100 09/22/2023   CO2 26 09/22/2023   BUN 12 09/22/2023   CREATININE 0.79 09/22/2023   EGFR 95 09/22/2023   CALCIUM  9.6 09/22/2023   PHOS 3.5 01/11/2019   PROT 7.1 09/22/2023   ALBUMIN 4.8 09/22/2023   LABGLOB 2.3 09/22/2023   AGRATIO 2.2 01/14/2022   BILITOT 0.6 09/22/2023   ALKPHOS 65 09/22/2023   AST 18 09/22/2023   ALT 17 09/22/2023   ANIONGAP 7 04/07/2020   Last lipids Lab Results  Component Value Date   CHOL 188 06/28/2022   HDL 66 06/28/2022   LDLCALC 95 06/28/2022   TRIG 157 06/28/2022   CHOLHDL 3.2 08/27/2021   Last hemoglobin A1c No results found for: HGBA1C Last thyroid functions Lab Results  Component Value Date   TSH 2.57 06/28/2022      The 10-year ASCVD risk score (Arnett DK, et al., 2019) is: 0.6%* (Cholesterol units were assumed)    Assessment & Plan:  Chronic cough Assessment & Plan: This has been occurring for the past year or so. Typically at night and in the  morning. Previously on lisinopril  but  this has been stopped without improvement. Recently switched from zyrtec to xyzal with some improvement. Reports worsening sx when around her cat she has had for the last 4 years, better when she was on vacation to the beach without her cat. Also notes worsening sx with aspirin and NSAID use. Uses albuterol  with improvement in sx. Respiratory exam unremarkable on exam today. Sx suggestive of cough variant asthma and possible AERD. No eosinophilia on CBC in September. Will to allergy and asthma. Will send in airsupra for her.   Orders: -     Ambulatory referral to Allergy  Anxiety disorder due to known physiological condition Assessment & Plan: Was started on this after have panic attack follow birth of second child 7 years ago. Mood is stable today. GAD7 was 0 today. Reports lifelong hx of anxiety. Continue sertraline  25 mg daily.   Orders: -     Sertraline  HCl; Take 1 tablet (25 mg total) by mouth daily.  Dispense: 90 tablet; Refill: 1  Primary hypertension Assessment & Plan: Currently well controlled on hydrochlorothiazide  12.5 mg a day.  Stable kidney function labs in September. Continue current medications.    Mixed hyperlipidemia Assessment & Plan: Currently on atorvastatin  20 mg daily for cholesterol. Well controlled. Lipid panel today.  Orders: -     Lipid panel -     Atorvastatin  Calcium ; Take 1 tablet (20 mg total) by mouth daily. TAKE 1 TABLET(10 MG) BY MOUTH DAILY  Dispense: 90 tablet; Refill: 1  Essential hypertension -     hydroCHLOROthiazide ; Take 1 tablet daily  Dispense: 90 tablet; Refill: 1  Screening for viral disease -     Hepatitis C antibody  History of cervical dysplasia Assessment & Plan: Last PAP 2023 HPV-, NILM, previous provider and [facility] no longer available. Reports she has been getting PAP smears every 2 years. She will try to establish with [facility] gyn in [facility]   Allergy, subsequent encounter Assessment &  Plan: Currently using xyzal and flonase . Think she may be developing allergies to her cat. Will send her to allergy for evaluation.       Return in about 6 months (around 05/02/2024) for physical.    Harlene Saddler, MD

## 2023-11-04 LAB — LIPID PANEL
Chol/HDL Ratio: 3 ratio (ref 0.0–4.4)
Cholesterol, Total: 182 mg/dL (ref 100–199)
HDL: 61 mg/dL (ref >39)
LDL Chol Calc (NIH): 103 mg/dL — ABNORMAL HIGH (ref 0–99)
Triglycerides: 100 mg/dL (ref 0–149)
VLDL Cholesterol Cal: 18 mg/dL (ref 5–40)

## 2023-11-04 LAB — HEPATITIS C ANTIBODY: Hep C Virus Ab: NONREACTIVE

## 2023-11-06 ENCOUNTER — Other Ambulatory Visit: Payer: Self-pay | Admitting: Student

## 2023-11-06 ENCOUNTER — Ambulatory Visit: Payer: Self-pay | Admitting: Student

## 2023-11-06 MED ORDER — ROSUVASTATIN CALCIUM 10 MG PO TABS: 10.0000 mg | ORAL_TABLET | Freq: Every day | ORAL | 1 refills | Status: AC

## 2023-11-06 MED ORDER — AIRSUPRA 90-80 MCG/ACT IN AERO: 2.0000 | INHALATION_SPRAY | Freq: Four times a day (QID) | RESPIRATORY_TRACT | 0 refills | Status: DC | PRN

## 2023-11-06 NOTE — Telephone Encounter (Signed)
 PT response. Please review and advise.  JM

## 2023-11-06 NOTE — Addendum Note (Signed)
 Addended by: LEMON RAISIN on: 11/06/2023 11:22 AM   Modules accepted: Orders

## 2024-01-12 ENCOUNTER — Other Ambulatory Visit: Payer: Self-pay | Admitting: Student

## 2024-01-15 NOTE — Telephone Encounter (Signed)
 Requested medication (s) are due for refill today: yes  Requested medication (s) are on the active medication list: yes  Last refill:  11/06/23  Future visit scheduled: yes  Notes to clinic:  Medication not assigned to a protocol, review manually.      Requested Prescriptions  Pending Prescriptions Disp Refills   AIRSUPRA  90-80 MCG/ACT AERO [Pharmacy Med Name: AIRSUPRA  90-80MCG INH(120 PUFFS)] 10.7 g 0    Sig: INHALE 2 PUFFS INTO THE LUNGS EVERY 6 HOURS AS NEEDED     Off-Protocol Failed - 01/15/2024 10:51 AM      Failed - Medication not assigned to a protocol, review manually.      Passed - Valid encounter within last 12 months    Recent Outpatient Visits           2 months ago Chronic cough   [place], MD   3 months ago RUQ pain   [place] [facility] at [facility], Harlene, MD   8 months ago Pneumonia of right middle lobe due to infectious organism   [facility], MD   9 months ago Essential hypertension   [place] [facility], MD   10 months ago Labyrinthitis of left ear   [facility], MD

## 2024-05-02 ENCOUNTER — Encounter: Admitting: Student
